# Patient Record
Sex: Male | Born: 1955 | Race: White | Hispanic: No | Marital: Married | Smoking: Never smoker
Health system: Southern US, Community
[De-identification: ages and names within clinical notes are randomized; demographics above are authoritative.]

---

## 1999-05-31 ENCOUNTER — Encounter: Admission: RE | Admit: 1999-05-31 | Discharge: 1999-05-31 | Payer: Self-pay

## 1999-06-23 ENCOUNTER — Encounter: Payer: Self-pay | Admitting: Emergency Medicine

## 1999-06-23 ENCOUNTER — Emergency Department (HOSPITAL_COMMUNITY): Admission: EM | Admit: 1999-06-23 | Discharge: 1999-06-23 | Payer: Self-pay | Admitting: Emergency Medicine

## 1999-08-16 ENCOUNTER — Other Ambulatory Visit: Admission: RE | Admit: 1999-08-16 | Discharge: 1999-08-16 | Payer: Self-pay | Admitting: Urology

## 1999-10-24 ENCOUNTER — Ambulatory Visit (HOSPITAL_COMMUNITY): Admission: RE | Admit: 1999-10-24 | Discharge: 1999-10-24 | Payer: Self-pay | Admitting: Cardiology

## 2006-04-23 ENCOUNTER — Encounter: Admission: RE | Admit: 2006-04-23 | Discharge: 2006-04-23 | Payer: Self-pay | Admitting: Neurology

## 2006-04-27 ENCOUNTER — Encounter: Admission: RE | Admit: 2006-04-27 | Discharge: 2006-04-27 | Payer: Self-pay | Admitting: Neurology

## 2007-12-27 IMAGING — CR DG SKULL COMPLETE 4+V
4 series · 4 of 4 positions shown · non-contrast
Comparison: None.

CLINICAL DATA: Question metal. Patient for MRI.

[[person_name] * (1 of 2)]
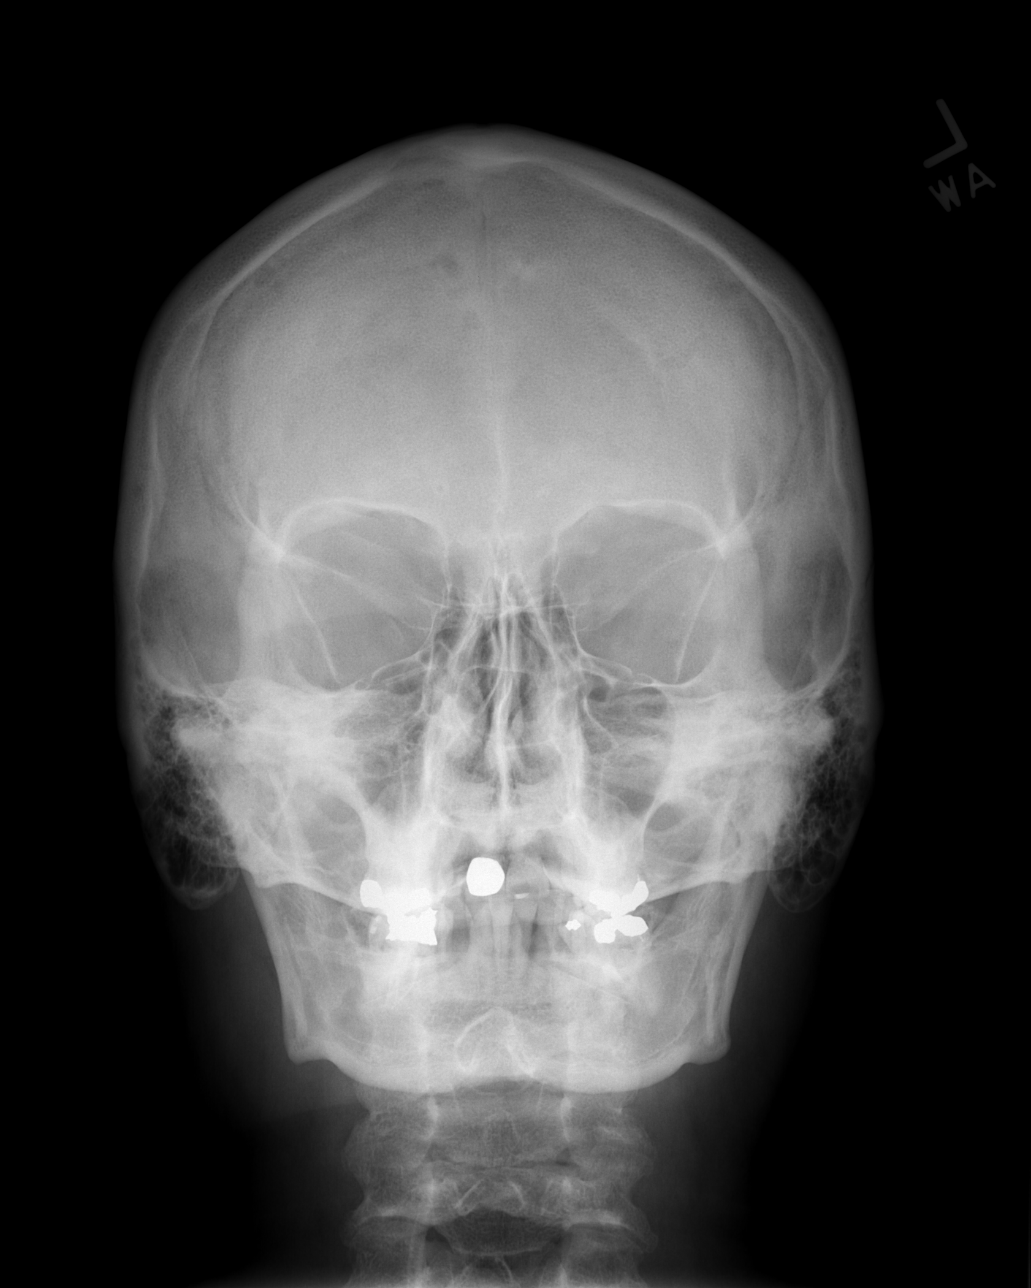

[w skull lat * (1 of 2)]
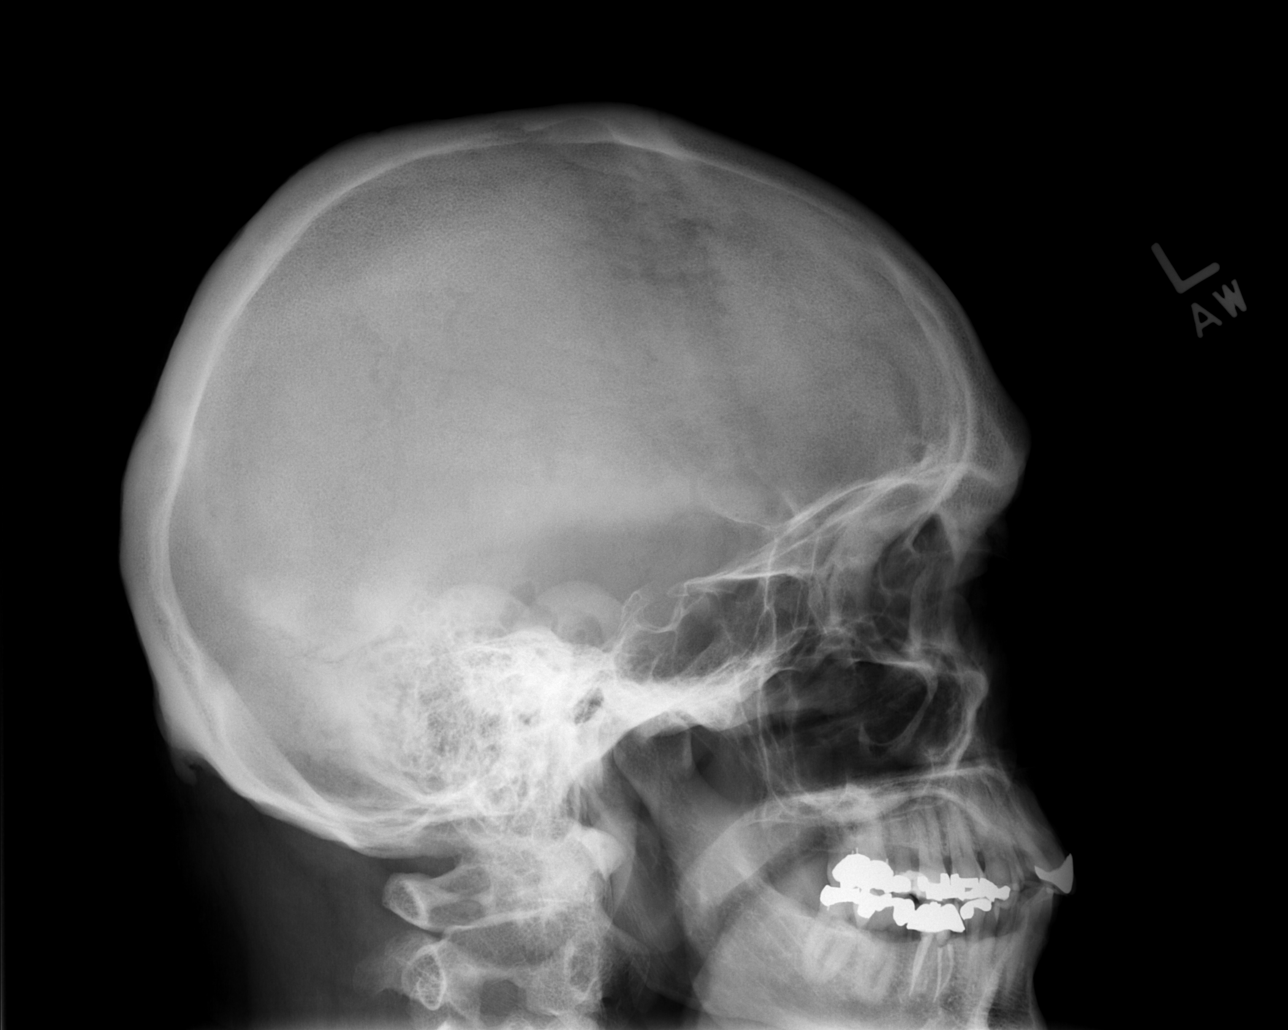

[w skull lat * (2 of 2)]
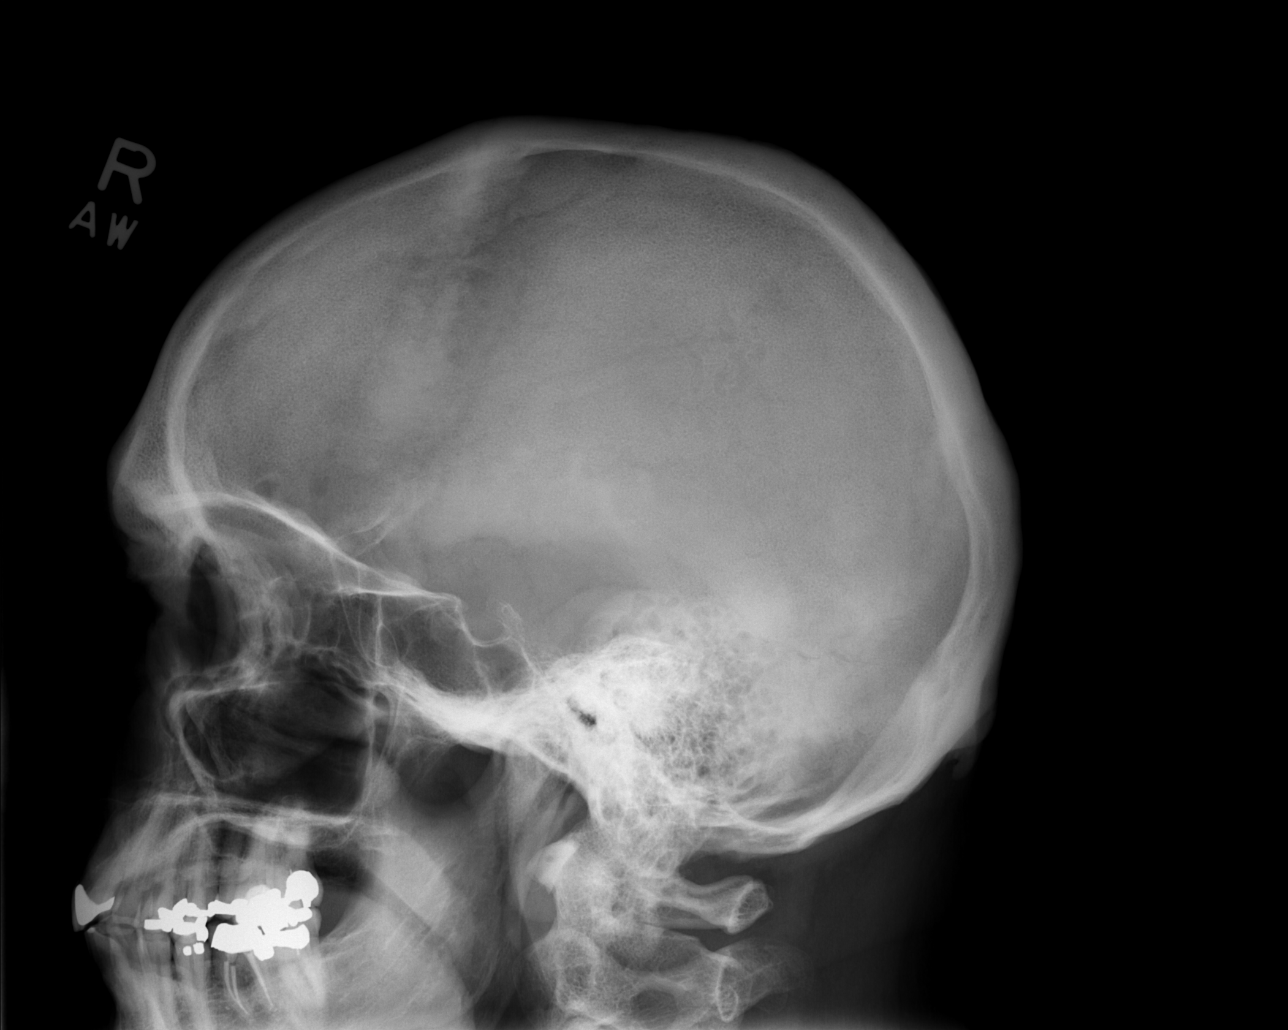

[[person_name] * (2 of 2)]
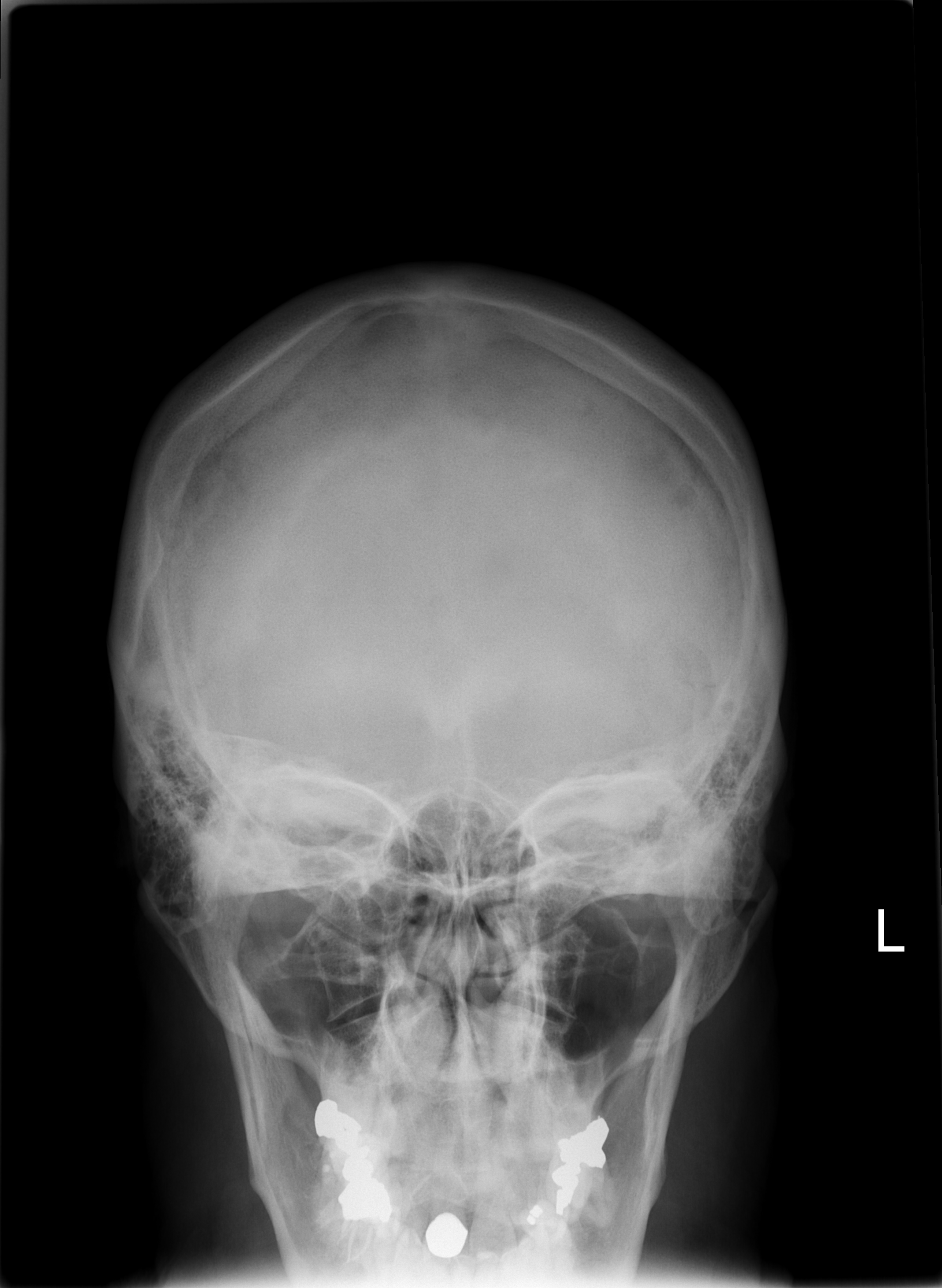

[4 of 4 positions shown; findings below may reference images not displayed]

SKULL - 4 VIEW:

No evidence for a metallic orbital foreign body. There is no evidence for
intracranial metallic foreign body such as aneurysm clip. Multiple metallic
dental fillings are apparent.
IMPRESSION: No intra orbital or intracranial metallic foreign body.

## 2015-04-28 ENCOUNTER — Telehealth: Payer: Self-pay | Admitting: *Deleted

## 2015-04-28 ENCOUNTER — Encounter: Payer: Self-pay | Admitting: Sports Medicine

## 2015-04-28 ENCOUNTER — Ambulatory Visit (INDEPENDENT_AMBULATORY_CARE_PROVIDER_SITE_OTHER): Payer: 59

## 2015-04-28 ENCOUNTER — Ambulatory Visit (INDEPENDENT_AMBULATORY_CARE_PROVIDER_SITE_OTHER): Payer: 59 | Admitting: Sports Medicine

## 2015-04-28 VITALS — BP 153/92 | HR 63 | Resp 14

## 2015-04-28 DIAGNOSIS — M722 Plantar fascial fibromatosis: Secondary | ICD-10-CM

## 2015-04-28 DIAGNOSIS — M79672 Pain in left foot: Secondary | ICD-10-CM

## 2015-04-28 DIAGNOSIS — M79671 Pain in right foot: Secondary | ICD-10-CM

## 2015-04-28 DIAGNOSIS — Z01812 Encounter for preprocedural laboratory examination: Secondary | ICD-10-CM

## 2015-04-28 MED ORDER — TRIAMCINOLONE ACETONIDE 10 MG/ML IJ SUSP: 10.0000 mg | Freq: Once | INTRAMUSCULAR | Status: AC

## 2015-04-28 MED ORDER — METHYLPREDNISOLONE 4 MG PO TBPK: ORAL_TABLET | ORAL | Status: DC

## 2015-04-28 MED ORDER — MELOXICAM 15 MG PO TABS: 15.0000 mg | ORAL_TABLET | Freq: Every day | ORAL | Status: AC

## 2015-04-28 NOTE — Telephone Encounter (Signed)
Prior authorization for MRI left foot [id number], dx M72.2 [phone] for case# [id number].

## 2015-04-28 NOTE — Patient Instructions (Signed)

## 2015-04-28 NOTE — Progress Notes (Signed)
Subjective:    Patient ID: Jared Hodges, male    DOB: [birth year], 59 y.o.   MRN: [id number]  HPI Jared Hodges 59 year old male patient presents here today for B/L foot pain with the left foot worse, has been seen by Dr Legrand [facility],[place]) and has had 4 injections (2 to the heel and 2 to the forefoot) icing, night splint, inserts, & xrays which did not help. Patient states that the left foot has been bothering him for over 6 months and was to the point where he was considering surgery however in mid June or July felt a pop in the left heel that caused the heel to feel better for 2-3 days and then the symptoms of heel pain re-occurred now to the point to where it hurts to walk. Patient admits to compensation pain to the forefoot and some residual numbness and warm sensation where he recieved injections in the forefoot. Patient denies any recent trauma, injury, knee, hip, back pain. Patient states that he desires a new podiatrist since his previous podiatrist offices has changed and is too far to drive to.   Patient reports a 2 week history of right heel pain; patient states that he is having pain similar to how his left heel initially presented with post-static dyskenisia. Patient states that heel pain in right 6/10 and desires treatment. Patient denies any related consitutional symptoms or any other pedal complaints at this time.  There are no active problems to display for this patient.  No current outpatient prescriptions on file prior to visit.   No current facility-administered medications on file prior to visit.    No Known Allergies    Review of Systems  All other systems reviewed and are negative.      Objective:   Physical Exam  General: The patient is alert and oriented x3 in no acute distress.  Dermatology: Skin is warm, dry and supple bilateral lower extremities. Nails 1-10 are normal. There is no erythema, edema, no eccymosis, no open lesions  present. Integument is otherwise unremarkable.  Vascular: Dorsalis Pedis pulse and Posterior Tibial pulse are 2/4 bilateral. Capillary fill time is immediate to all digits.  Neurological: Grossly intact to light touch with an achilles reflex of +2/5 and a  negative Tinel's sign bilateral.  Musculoskeletal: No tenderness to left forefoot. Tenderness to palpation at the medial calcaneal tubercale and through the insertion of the plantar fascia bilateral. Muscular strength 5/5 in all groups bilateral. No limitation or pain with midtarsal, stj, ankle range of motion bilateral.   Gait: Unassisted, Antalgic avoid weights on heels  Xray: 3 views right foot: Pes Cavus foot type, mild fullness at plantar fascia and enthesopathy, 1st ray elevatus. No fracture or dislocation. Patient to bring in xray reports from left foot from previous podiatrist.      Assessment & Plan:   Problem List Items Addressed This Visit    None    Visit Diagnoses    Plantar fasciitis, bilateral    -  Primary    Right acute (2 weeks) Left chronic (>6 months)    Relevant Medications    meloxicam (MOBIC) 15 MG tablet    methylPREDNISolone (MEDROL DOSEPAK) 4 MG TBPK tablet    Other Relevant Orders    DG Foot Complete Right    MR Foot Left W Wo Contrast    Foot pain, bilateral        Relevant Medications    meloxicam (MOBIC) 15 MG  tablet    methylPREDNISolone (MEDROL DOSEPAK) 4 MG TBPK tablet       -Complete examination performed. Discussed with patient in detail the condition of plantar fasciitis, how this occurs and general treatment options. Explained both conservative and surgical treatments.  -After oral consent and aseptic prep, injected into the right heel a mixture containing 1 ml of 1%  plain lidocaine, 1 ml 0.25% plain marcaine, 0.5 ml of kenalog 10 and 0.5 ml of dexamethasone phosphate. -Rx MRI without contrast for the left foot chronic plantar fasciitis and further evaluation due to long term unresolved  symptoms -Rx Medrol dose pack and Meloxicam to start after dose pack is completed -Recommended good supportive shoes and advised use of OTC insert. -Explained in detail the use of the fascial brace dispensed at today's visit. -Explained and dispensed to patient daily stretching exercises. -Recommend patient to ice affected area 1-2x daily. -Patient to return to office after MRI or sooner if problems or questions  Arise.  Jared Hodges, DPM

## 2015-05-27 ENCOUNTER — Ambulatory Visit (INDEPENDENT_AMBULATORY_CARE_PROVIDER_SITE_OTHER): Payer: 59 | Admitting: Sports Medicine

## 2015-05-27 ENCOUNTER — Encounter: Payer: Self-pay | Admitting: Sports Medicine

## 2015-05-27 DIAGNOSIS — M722 Plantar fascial fibromatosis: Secondary | ICD-10-CM | POA: Diagnosis not present

## 2015-05-27 DIAGNOSIS — M79672 Pain in left foot: Secondary | ICD-10-CM

## 2015-05-27 DIAGNOSIS — M79671 Pain in right foot: Secondary | ICD-10-CM | POA: Diagnosis not present

## 2015-05-27 NOTE — Patient Instructions (Signed)
Pre-Operative Instructions  Congratulations, you have decided to take an important step to improving your quality of life.  You can be assured that the doctors of [facility] will be with you every step of the way.  1. Plan to be at the surgery center/hospital at least 1 (one) hour prior to your scheduled time unless otherwise directed by the surgical center/hospital staff.  You must have a responsible adult accompany you, remain during the surgery and drive you home.  Make sure you have directions to the surgical center/hospital and know how to get there on time. 2. For hospital based surgery you will need to obtain a history and physical form from your family physician within 1 month prior to the date of surgery- we will give you a form for you primary physician.  3. We make every effort to accommodate the date you request for surgery.  There are however, times where surgery dates or times have to be moved.  We will contact you as soon as possible if a change in schedule is required.   4. No Aspirin/Ibuprofen for one week before surgery.  If you are on aspirin, any non-steroidal anti-inflammatory medications (Mobic, Aleve, Ibuprofen) you should stop taking it 7 days prior to your surgery.  You make take Tylenol  For pain prior to surgery.  5. Medications- If you are taking daily heart and blood pressure medications, seizure, reflux, allergy, asthma, anxiety, pain or diabetes medications, make sure the surgery center/hospital is aware before the day of surgery so they may notify you which medications to take or avoid the day of surgery. 6. No food or drink after midnight the night before surgery unless directed otherwise by surgical center/hospital staff. 7. No alcoholic beverages 24 hours prior to surgery.  No smoking 24 hours prior to or 24 hours after surgery. 8. Wear loose pants or shorts- loose enough to fit over bandages, boots, and casts. 9. No slip on shoes, sneakers are best. 10. Bring  your boot with you to the surgery center/hospital.  Also bring crutches or a walker if your physician has prescribed it for you.  If you do not have this equipment, it will be provided for you after surgery. 11. If you have not been contracted by the surgery center/hospital by the day before your surgery, call to confirm the date and time of your surgery. 12. Leave-time from work may vary depending on the type of surgery you have.  Appropriate arrangements should be made prior to surgery with your employer. 13. Prescriptions will be provided immediately following surgery by your doctor.  Have these filled as soon as possible after surgery and take the medication as directed. 14. Remove nail polish on the operative foot. 15. Wash the night before surgery.  The night before surgery wash the foot and leg well with the antibacterial soap provided and water paying special attention to beneath the toenails and in between the toes.  Rinse thoroughly with water and dry well with a towel.  Perform this wash unless told not to do so by your physician.  Enclosed: 1 Ice pack (please put in freezer the night before surgery)   1 Hibiclens skin cleaner   Pre-op Instructions  If you have any questions regarding the instructions, do not hesitate to call our office.  [place]: [address] [place], [place] [address] [phone]  [place]: [address] [place], [place] [address] [phone]  [place]: [address]  [place], [place] [address] [phone]  Dr. Richard   Tuchman DPM, Dr. Norman Regal DPM Dr. Richard Sikora DPM, Dr. M. Todd Hyatt DPM, Dr. Kathryn Egerton DPM 

## 2015-05-27 NOTE — Progress Notes (Signed)
Patient ID: Jared Hodges, male   DOB: Dec 06, 1955, 59 y.o.   MRN: [id number] Subjective: Jared Hodges is a 59 y.o. male returns to office for follow up evaluation after Right heel injection for plantar fasciitis, injection #1 administered 1 month ago. Patient states that the injection seems to help his pain. Patient denies any recent changes in medications or new problems since last visit. Patient is also here for review of MRI of left. Denies any other pedal concerns at this time.  There are no active problems to display for this patient.  Current Outpatient Prescriptions on File Prior to Visit  Medication Sig Dispense Refill  . lisinopril (PRINIVIL,ZESTRIL) 20 MG tablet TK 1 T PO DAILY FOR BLOOD PRESSURE  5  . meloxicam (MOBIC) 15 MG tablet Take 1 tablet (15 mg total) by mouth daily. 30 tablet 0  . methylPREDNISolone (MEDROL DOSEPAK) 4 MG TBPK tablet Dose Pack; Take as instructed 21 tablet 0   Current Facility-Administered Medications on File Prior to Visit  Medication Dose Route Frequency Provider Last Rate Last Dose  . triamcinolone acetonide (KENALOG) 10 MG/ML injection 10 mg  10 mg Other Once [employer], DPM       No Known Allergies  Objective:   General:  Alert and oriented x 3, in no acute distress  Dermatology: Skin is warm, dry, and supple bilateral. Nails are within normal limits. There is no lower extremity erythema, no eccymosis, no open lesions present bilateral.   Vascular: Dorsalis Pedis and Posterior Tibial pedal pulses are 2/4 bilateral. + hair growth noted bilateral. Capillary Fill Time is 3 seconds in all digits. No varicosities, No edema bilateral lower extremities.   Neurological: Sensation grossly intact to light touch with an achilles reflex of +2 and a  negative Tinel's sign bilateral. Vibratory, sharp/dull, Semmes Weinstein Monofilament within normal limits.   Musculoskeletal: There is decreased tenderness to palpation at the medial calcaneal tubercale  and through the insertion of the plantar fascia on the right foot. There is still tenderness on the left plantar fascia insertion and through medial arch. No pain to left ankle. No pain with compression to calcaneus or application of tuning fork.  All joints range of motion within normal limits bilateral. Strength 5/5 bilateral.   Assessment and Plan: Problem List Items Addressed This Visit    None    Visit Diagnoses    Plantar fasciitis of left foot    -  Primary    chronic, MRI confirmed Chronic plantar fasciitis, partial rupture of medial band with Peroneal tenosynovitis (compensation)     Plantar fasciitis of right foot        improving    Foot pain, bilateral          -Complete examination performed.  -MRI results discussed for left foot  -Discussed with patient in detail the condition of plantar fasciitis, how this  occurs related to the foot type of the patient and general treatment options. - Patient opted for surgical intervention for Left foot; Consent signed and obtained for Left EPF; risks and benefits explained; no gaurantees given; Office to schedule surgery at [facility] at next available date and time. Patient provided pre-op instruction and packet. -Dispensed CAM boot to use post-op  -Continue with Meloxicam until complete. -Continue with fascial brace daily.  -Continue with stretching, icing, good supportive shoes, inserts daily.  -Discussed long term care and reocurrence; will closely monitor; if fails to improve will consider other treatment modalities for the right.  -Patient to return  to office after surgery, left EPF for follow up or sooner if problems or questions arise.  Landis Martins, DPM

## 2015-05-31 ENCOUNTER — Telehealth: Payer: Self-pay | Admitting: *Deleted

## 2015-05-31 NOTE — Telephone Encounter (Signed)
"  We thought we would have heard something by now.  We'd like to set up my husband surgery."  How soon would you like to do it?  "He'd like to do it as soon as possible."  She can do it on 06/16/2015.  "Okay, that will be great.  We had tried to register before but couldn't.  Where will it be done at?"  Surgery is performed at [address] in the [place] area, not by the hospital.  Surgical center will call him a day or two prior to surgery with the arrival time.  Give me a chance to get it scheduled and then you can register.

## 2015-06-01 ENCOUNTER — Telehealth: Payer: Self-pay | Admitting: *Deleted

## 2015-06-01 NOTE — Telephone Encounter (Signed)
Surgery scheduled for 06/16/2015 was authorized by [facility] for code [phone].  Authorization number is [id number].  I faxed authorization to the surgical center.

## 2015-06-15 ENCOUNTER — Telehealth: Payer: Self-pay | Admitting: *Deleted

## 2015-06-15 NOTE — Telephone Encounter (Signed)
"  My husband is scheduled for surgery on tomorrow and we have not received a call about the time."  You can give the surgical center a call.  Their number is [phone].  "Okay, I'll call now."

## 2015-06-16 DIAGNOSIS — M722 Plantar fascial fibromatosis: Secondary | ICD-10-CM | POA: Diagnosis not present

## 2015-06-17 ENCOUNTER — Telehealth: Payer: Self-pay | Admitting: Sports Medicine

## 2015-06-17 NOTE — Telephone Encounter (Signed)
Post op check phone call made to patient. No answer. Left voicemail for patient to call office with any problems or concerns. Call back # provided. Patient to follow up in office in 1 week for continue post op care.

## 2015-06-24 ENCOUNTER — Ambulatory Visit (INDEPENDENT_AMBULATORY_CARE_PROVIDER_SITE_OTHER): Payer: 59 | Admitting: Sports Medicine

## 2015-06-24 ENCOUNTER — Encounter: Payer: Self-pay | Admitting: Sports Medicine

## 2015-06-24 ENCOUNTER — Ambulatory Visit (INDEPENDENT_AMBULATORY_CARE_PROVIDER_SITE_OTHER): Payer: 59

## 2015-06-24 DIAGNOSIS — Z9889 Other specified postprocedural states: Secondary | ICD-10-CM

## 2015-06-24 DIAGNOSIS — M79672 Pain in left foot: Secondary | ICD-10-CM

## 2015-06-24 DIAGNOSIS — Z8619 Personal history of other infectious and parasitic diseases: Secondary | ICD-10-CM

## 2015-06-24 DIAGNOSIS — M722 Plantar fascial fibromatosis: Secondary | ICD-10-CM

## 2015-06-24 MED ORDER — METRONIDAZOLE 500 MG PO TABS: 500.0000 mg | ORAL_TABLET | Freq: Three times a day (TID) | ORAL | Status: AC

## 2015-06-24 MED ORDER — METHYLPREDNISOLONE 4 MG PO TBPK: ORAL_TABLET | ORAL | Status: DC

## 2015-06-24 NOTE — Progress Notes (Signed)
Patient ID: Jared Hodges, male   DOB: [birth year], 59 y.o.   MRN: [id number] Subjective: Jared Hodges is a 59 y.o. male patient seen today in office for POV #1 (DOS 06-16-15), S/P Left EPF and Right heel steroid injection. Patient denies pain at surgical site, denies calf pain, denies headache, chest pain, shortness of breath, nausea, vomiting, fever, or chills. Patient states that he is doing well and not requiring pain medication. Patient reports that the injection on the right heel lasted for about 3 days and now the pain is back pretty bad; patient is concerned that he may need surgery on his right foot in the near future. No other issues noted.   Objective: General: No acute distress, AAOx3   Left foot: Sutures intact with no gapping or dehiscence at surgical site, mild swelling and brusing to left heel, no erythema, no warmth, no drainage, no signs of infection noted, Capillary fill time <3 seconds in all digits, gross sensation present via light touch to left foot. No pain or crepitation with range of motion left foot.  No pain with calf compression.   There is still tenderness to plantar fascia on right foot.   Post Op Xray, 3 views, Left foot: Normal osseous mineralization, unchanged calcaneal spur, there is small soft tissue defect at site of EPF, Soft tissue swelling within normal limits for post op status. No acute pathology or findings.   Assessment and Plan:  Problem List Items Addressed This Visit    None    Visit Diagnoses    Left foot pain    -  Primary    improving    Relevant Orders    DG Foot Complete Left    Status post foot surgery, unspecified laterality        Left EPF and Steroid injection on right, 06-16-15. Left is doing well healing with no complications, Right is still very painful    Plantar fasciitis of right foot        minimally improved after 2nd steroid injection     Relevant Medications    methylPREDNISolone (MEDROL DOSEPAK) 4 MG TBPK tablet    History of thrush        Relevant Medications    metroNIDAZOLE (FLAGYL) 500 MG tablet       -Patient seen and evaluated - For right foot rx steroid dose pack and flagyl (hx of thrush when on steroids). Advised patient to consider EPF vs EPAT if fails to improve. Cont with stretching, icing, good shoes, bracing daily on right.  -Left foot Post op xrays reviewed  -Applied dry sterile dressing to surgical site left foot secured with paper tape and stockinet  -Advised patient to make sure to keep dressings clean, dry, and intact to left surgical site -Advised patient to transition from CAM walker to post op shoe and then to sneaker over the next 2 weeks -Cont with night splint every night on left and pain medication as needed -Advised patient to limit activity to necessity on left  -Advised patient to ice and elevate as necessary on left   -Will plan for suture removal at next office visit. In the meantime, patient to call office if any issues or problems arise.   Landis Martins, DPM

## 2015-07-08 ENCOUNTER — Encounter: Payer: Self-pay | Admitting: Sports Medicine

## 2015-07-08 ENCOUNTER — Ambulatory Visit (INDEPENDENT_AMBULATORY_CARE_PROVIDER_SITE_OTHER): Payer: 59 | Admitting: Sports Medicine

## 2015-07-08 DIAGNOSIS — M79672 Pain in left foot: Secondary | ICD-10-CM

## 2015-07-08 DIAGNOSIS — M79671 Pain in right foot: Secondary | ICD-10-CM

## 2015-07-08 DIAGNOSIS — Z9889 Other specified postprocedural states: Secondary | ICD-10-CM

## 2015-07-08 DIAGNOSIS — M722 Plantar fascial fibromatosis: Secondary | ICD-10-CM

## 2015-07-08 MED ORDER — TRIAMCINOLONE ACETONIDE 10 MG/ML IJ SUSP: 10.0000 mg | Freq: Once | INTRAMUSCULAR | Status: AC

## 2015-07-08 NOTE — Progress Notes (Signed)
Patient ID: Jared Hodges, male   DOB: 23-May-1956, 59 y.o.   MRN: [id number]  Subjective: Jared Hodges is a 59 y.o. male patient seen today in office for POV #2 (DOS 06-16-15), S/P Left EPF and Right heel steroid injection. Patient denies pain at surgical site, denies calf pain, denies headache, chest pain, shortness of breath, nausea, vomiting, fever, or chills. Admits to numbness today in the left toes that he states that has been present before surgery and admits to even ocassional numbness on the right foot; feeling like he has a bunched up sock on. Patient feels like the right heel is still pretty painful and desires treatment. No other issues noted.  There are no active problems to display for this patient.  Current Outpatient Prescriptions on File Prior to Visit  Medication Sig Dispense Refill  . lisinopril (PRINIVIL,ZESTRIL) 20 MG tablet TK 1 T PO DAILY FOR BLOOD PRESSURE  5  . meloxicam (MOBIC) 15 MG tablet Take 1 tablet (15 mg total) by mouth daily. 30 tablet 0  . methylPREDNISolone (MEDROL DOSEPAK) 4 MG TBPK tablet Take as instructed (DOSE PACK) 21 tablet 0  . metroNIDAZOLE (FLAGYL) 500 MG tablet Take 1 tablet (500 mg total) by mouth 3 (three) times daily. 21 tablet 0   Current Facility-Administered Medications on File Prior to Visit  Medication Dose Route Frequency Provider Last Rate Last Dose  . triamcinolone acetonide (KENALOG) 10 MG/ML injection 10 mg  10 mg Other Once [employer], DPM       No Known Allergies    Objective: General: No acute distress, AAOx3   Left foot: Sutures intact with no gapping or dehiscence at surgical site, no swelling or ecchymosis to left heel, no erythema, no warmth, no drainage, no signs of infection noted, Capillary fill time <3 seconds in all digits, gross sensation present via light touch to left foot. There is subjective numbness to all toes with the 3,4,5 toes mostly involved. Negative tinels sign. No pain or crepitation with range of  motion left foot.  No pain with calf compression.   There is still tenderness to plantar fascia on right foot at the insertion. No other acute findings on the right foot.    Assessment and Plan:  Problem List Items Addressed This Visit    None    Visit Diagnoses    Status post left foot surgery    -  Primary    EPF, 06-16-15, healing well without complication    Plantar fasciitis of right foot        Relevant Medications    triamcinolone acetonide (KENALOG) 10 MG/ML injection 10 mg    Foot pain, bilateral           -Patient seen and evaluated - For right foot, After verbal consent injected 3cc mixture of lidocaine, marcaine, dex, kenalog-10 to right heel at medial calcaneal tubercle via direct plantar approach. Advised patient to consider EPF vs EPAT if fails to improve. Cont with stretching, icing, good shoes, bracing daily on right daily.  -For left foot, removed sutures and applied steristrips to site. Patient may now shower/bathe as normal.  -Dispensed compression anklet for left to prevent swelling.  -Advised patient to transition from post op shoe to sneaker over the next 2 weeks -Cont with night splint every night on left and pain medication as needed until follow up visit -Advised patient to limit activity to necessity and continue with ice and elevation as needed daily  -Patient to return in 2 weeks  for recheck; discussed with patient if numbness progresses will send to neurology/NCV/EMG. In the meantime, advised patient to follow up for PCP for diabetic work up. Patient to call office if any issues or problems arise.   Landis Martins, DPM

## 2015-07-12 ENCOUNTER — Encounter: Payer: Self-pay | Admitting: Sports Medicine

## 2015-07-14 ENCOUNTER — Telehealth: Payer: Self-pay | Admitting: *Deleted

## 2015-07-14 NOTE — Telephone Encounter (Signed)
Pt's wife, Jana Half states pt has decided to have the Shockwave on the other foot, would they be able to have it performed the same day 07/22/2015 appt.  I told Bryson Ha was only performed in the [place] office, and by a trained nurse, but Dr. Cannon Kettle would want to instruct pt in the procedure, and aftercare at the next appt.

## 2015-07-22 ENCOUNTER — Encounter: Payer: 59 | Admitting: Sports Medicine

## 2015-07-22 ENCOUNTER — Ambulatory Visit (INDEPENDENT_AMBULATORY_CARE_PROVIDER_SITE_OTHER): Payer: 59 | Admitting: Sports Medicine

## 2015-07-22 ENCOUNTER — Encounter: Payer: Self-pay | Admitting: Sports Medicine

## 2015-07-22 DIAGNOSIS — M792 Neuralgia and neuritis, unspecified: Secondary | ICD-10-CM

## 2015-07-22 DIAGNOSIS — M79671 Pain in right foot: Secondary | ICD-10-CM

## 2015-07-22 DIAGNOSIS — Z9889 Other specified postprocedural states: Secondary | ICD-10-CM

## 2015-07-22 DIAGNOSIS — M79672 Pain in left foot: Secondary | ICD-10-CM

## 2015-07-22 DIAGNOSIS — M722 Plantar fascial fibromatosis: Secondary | ICD-10-CM

## 2015-07-22 NOTE — Progress Notes (Signed)
DOS 06-16-15  EPF left   Rx'd Percocet 7.5/325  #30 and Phenergan 25 mg #30

## 2015-07-22 NOTE — Progress Notes (Signed)
Patient ID: Jared Hodges, male   DOB: 1956/02/07, 59 y.o.   MRN: [id number]  Subjective: Jared Hodges is a 59 y.o. male patient seen today in office for POV #3 (DOS 06-16-15), S/P Left EPF and Right heel steroid injection; patient received another injection via plantar approach at right heel 2 weeks ago. Patient denies pain at surgical site on left, denies calf pain, denies headache, chest pain, shortness of breath, nausea, vomiting, fever, or chills. Admits to numbness  The ball of the left greater than right foot that he states that has been present before surgery; feeling like he has a bunched up sock on;  State that this pain is becoming more persistent. Patient feels like the right heel is still pretty painful and desires to further discuss treatment options; pain has required crutches. No other issues noted.  There are no active problems to display for this patient.  Current Outpatient Prescriptions on File Prior to Visit  Medication Sig Dispense Refill  . lisinopril (PRINIVIL,ZESTRIL) 20 MG tablet TK 1 T PO DAILY FOR BLOOD PRESSURE  5  . meloxicam (MOBIC) 15 MG tablet Take 1 tablet (15 mg total) by mouth daily. 30 tablet 0  . methylPREDNISolone (MEDROL DOSEPAK) 4 MG TBPK tablet Take as instructed (DOSE PACK) 21 tablet 0  . metroNIDAZOLE (FLAGYL) 500 MG tablet Take 1 tablet (500 mg total) by mouth 3 (three) times daily. 21 tablet 0   Current Facility-Administered Medications on File Prior to Visit  Medication Dose Route Frequency Provider Last Rate Last Dose  . triamcinolone acetonide (KENALOG) 10 MG/ML injection 10 mg  10 mg Other Once [employer], DPM      . triamcinolone acetonide (KENALOG) 10 MG/ML injection 10 mg  10 mg Other Once [employer], DPM       No Known Allergies    Objective: General: No acute distress, AAOx3   Left foot:  Incisions to Left heel well coapted, no swelling or ecchymosis to left heel, no erythema, no warmth, no drainage, no signs of infection  noted, Capillary fill time <3 seconds in all digits, gross sensation present via light touch to left foot. There is subjective numbness to all toes with the 3,4,5 toes mostly involved  Especially at the plantar aspect feeling like patient is walking on bunched up sock. Negative tinels sign/mulder's sign. No pain or crepitation with range of motion left foot.  No pain with calf compression.   There is still tenderness to plantar fascia on right foot at the insertion. Neurovascular status intact. Negative Tinel's or Mulder sign. No other acute findings on the right foot.    Assessment and Plan:  Problem List Items Addressed This Visit    None    Visit Diagnoses    Status post left foot surgery    -  Primary    EPF, 06-16-15    Plantar fasciitis of right foot        Not responding to conservative care    Neuritis        L>R foot at ball feel like a "wet/bunched up sock"    Foot pain, bilateral           -Patient seen and evaluated - For right foot, Advised patient to consider EPF vs EPAT  Since this side has not responded to any conservative therapies; patient states that he would like to try EPAT and will arrange this through our [place] office -For left foot, surgical site appears to be doing much improved patient  to continue with good supportive shoes and daily stretching.  -Cont with night splint with alternation between right and left and pain medication as needed until follow up visit -Advised patient to limit activity to necessity and continue with protection, ice and elevation as needed daily for right and continue with good supportive shoes daily -For numbness advised patient to follow up for PCP for diabetic work up. Discussed with patient that once diabetes is ruled out, if persists will pursue neurology for nerve conduction studies/ EMGs to evaluate any other etiologies since MRI and clinical exam appears negative for any neuroma or localized nerve entrapment at the level of the  toes. Patient to call office if any issues or problems arise.   Landis Martins, DPM

## 2015-07-30 ENCOUNTER — Ambulatory Visit (INDEPENDENT_AMBULATORY_CARE_PROVIDER_SITE_OTHER): Payer: 59

## 2015-07-30 ENCOUNTER — Encounter: Payer: Self-pay | Admitting: Sports Medicine

## 2015-07-30 ENCOUNTER — Ambulatory Visit (INDEPENDENT_AMBULATORY_CARE_PROVIDER_SITE_OTHER): Payer: 59 | Admitting: Sports Medicine

## 2015-07-30 DIAGNOSIS — M722 Plantar fascial fibromatosis: Secondary | ICD-10-CM

## 2015-07-30 DIAGNOSIS — M79671 Pain in right foot: Secondary | ICD-10-CM

## 2015-07-30 DIAGNOSIS — M79672 Pain in left foot: Secondary | ICD-10-CM

## 2015-07-30 DIAGNOSIS — M792 Neuralgia and neuritis, unspecified: Secondary | ICD-10-CM

## 2015-07-30 DIAGNOSIS — Z9889 Other specified postprocedural states: Secondary | ICD-10-CM

## 2015-07-30 MED ORDER — HYDROCODONE-ACETAMINOPHEN 7.5-325 MG PO TABS: 1.0000 | ORAL_TABLET | Freq: Four times a day (QID) | ORAL | Status: AC | PRN

## 2015-07-30 NOTE — Progress Notes (Addendum)
Patient ID: Jared Hodges, male   DOB: [birth year], 60 y.o.   MRN: [id number]  Subjective: Jared Hodges is a 60 y.o. male patient seen today in office for POV #4 (DOS 06-16-15), S/P Left EPF and Right heel steroid injection; patient states that he was getting into his truck a few days ago and heard a pop and felt intense pain in his right heel; states that pain is a little better but still causing him to limp. Patient states that the left foot is doing ok but starting to feel a little pain in left heel concerned that the fascia may have started to scar back down. Patient still also has numbness in toes Left>Right as noted prior. No other issues noted.  There are no active problems to display for this patient.  Current Outpatient Prescriptions on File Prior to Visit  Medication Sig Dispense Refill  . lisinopril (PRINIVIL,ZESTRIL) 20 MG tablet TK 1 T PO DAILY FOR BLOOD PRESSURE  5  . meloxicam (MOBIC) 15 MG tablet Take 1 tablet (15 mg total) by mouth daily. 30 tablet 0  . methylPREDNISolone (MEDROL DOSEPAK) 4 MG TBPK tablet Take as instructed (DOSE PACK) 21 tablet 0  . metroNIDAZOLE (FLAGYL) 500 MG tablet Take 1 tablet (500 mg total) by mouth 3 (three) times daily. 21 tablet 0   Current Facility-Administered Medications on File Prior to Visit  Medication Dose Route Frequency Provider Last Rate Last Dose  . triamcinolone acetonide (KENALOG) 10 MG/ML injection 10 mg  10 mg Other Once [employer], DPM      . triamcinolone acetonide (KENALOG) 10 MG/ML injection 10 mg  10 mg Other Once [employer], DPM       No Known Allergies    Objective: General: No acute distress, AAOx3   Left foot: There is mild tenderness to medial calcaneal tubercle, Incisions to Left heel well coapted, no swelling or ecchymosis to left heel, no erythema, no warmth, no drainage, no signs of infection noted, Capillary fill time <3 seconds in all digits, gross sensation present via light touch to left foot. There is  subjective numbness to all toes with the 3,4,5 toes mostly involved  Especially at the plantar aspect feeling like patient is walking on bunched up sock. Negative tinels sign/mulder's sign. No pain or crepitation with range of motion left foot.  No pain with calf compression.   Right foot: There moderate tenderness to plantar fascia on right foot at the insertion; no ecchymosis, no obvious palpable gap or dell. Neurovascular status intact. Negative Tinel's or Mulder sign. Negative pain with tuning fork to calcaneus. No other acute findings on the right foot.    Xray Right foot: No acute pathology, no fracture, no dislocation.  Assessment and Plan:  Problem List Items Addressed This Visit    None    Visit Diagnoses    Right foot pain    -  Primary    Relevant Medications    HYDROcodone-acetaminophen (NORCO) 7.5-325 MG tablet    Other Relevant Orders    DG Foot 2 Views Right    Plantar fasciitis of right foot        Re-exacerabation with possible partial rupture; heard a pop a few days ago    Relevant Medications    HYDROcodone-acetaminophen (NORCO) 7.5-325 MG tablet    Status post left foot surgery        EPF    Left foot pain        Neuritis           -  Patient seen and evaluated -Right foot xrays obtained and reviewed with patient -For right foot, Advised patient to return to CAM boot, careful with gentle stretching, ice, and Norco for pain as needed. Will re-assess site on Monday to determine if we will start Shockwave vs order MRI for further Eval.  -For left foot, surgical site appears to be improving however has a little pain that is starting; patient to continue with good supportive shoes and daily stretching/Night splint. Will complimentary treat site with Shockwave on Monday. -For numbness advised patient to follow up for PCP for diabetic work up. Discussed with patient that once diabetes is ruled out, if persists will pursue neurology for nerve conduction studies/ EMGs to  evaluate any other etiologies since MRI and clinical exam appears negative for any neuroma or localized nerve entrapment at the level of the toes. Patient to see PCP on Monday morning. -Patient to call office if any issues or problems arise.   Jared Hodges, DPM

## 2015-08-02 ENCOUNTER — Ambulatory Visit: Payer: 59 | Admitting: Sports Medicine

## 2015-08-02 ENCOUNTER — Ambulatory Visit (INDEPENDENT_AMBULATORY_CARE_PROVIDER_SITE_OTHER): Payer: 59 | Admitting: Sports Medicine

## 2015-08-02 ENCOUNTER — Encounter: Payer: Self-pay | Admitting: Sports Medicine

## 2015-08-02 DIAGNOSIS — M722 Plantar fascial fibromatosis: Secondary | ICD-10-CM

## 2015-08-02 DIAGNOSIS — M79672 Pain in left foot: Secondary | ICD-10-CM

## 2015-08-02 DIAGNOSIS — M792 Neuralgia and neuritis, unspecified: Secondary | ICD-10-CM

## 2015-08-02 DIAGNOSIS — M79671 Pain in right foot: Secondary | ICD-10-CM

## 2015-08-02 DIAGNOSIS — Z9889 Other specified postprocedural states: Secondary | ICD-10-CM

## 2015-08-02 MED ORDER — GABAPENTIN 100 MG PO CAPS: 300.0000 mg | ORAL_CAPSULE | Freq: Every day | ORAL | Status: AC

## 2015-08-02 MED ORDER — TRAMADOL HCL 50 MG PO TABS: 50.0000 mg | ORAL_TABLET | Freq: Three times a day (TID) | ORAL | Status: AC | PRN

## 2015-08-02 NOTE — Progress Notes (Signed)
Patient ID: Jared Hodges, male   DOB: Nov 21, 1955, 60 y.o.   MRN: [id number]  Subjective: Jared Hodges is a 60 y.o. male patient seen today in office for POV #5 (DOS 06-16-15), S/P Left EPF and Right heel steroid injection; patient had injury to right foot few days ago and states that it feels like it is easing up; feels best in rubber boots. Patient still also has numbness in toes Left>Right as noted prior; Patient to follow up with PCP for diabetic work up. No other issues noted.  There are no active problems to display for this patient.  Current Outpatient Prescriptions on File Prior to Visit  Medication Sig Dispense Refill  . HYDROcodone-acetaminophen (NORCO) 7.5-325 MG tablet Take 1 tablet by mouth every 6 (six) hours as needed for moderate pain. 30 tablet 0  . lisinopril (PRINIVIL,ZESTRIL) 20 MG tablet TK 1 T PO DAILY FOR BLOOD PRESSURE  5  . meloxicam (MOBIC) 15 MG tablet Take 1 tablet (15 mg total) by mouth daily. 30 tablet 0  . methylPREDNISolone (MEDROL DOSEPAK) 4 MG TBPK tablet Take as instructed (DOSE PACK) 21 tablet 0  . metroNIDAZOLE (FLAGYL) 500 MG tablet Take 1 tablet (500 mg total) by mouth 3 (three) times daily. 21 tablet 0   Current Facility-Administered Medications on File Prior to Visit  Medication Dose Route Frequency Provider Last Rate Last Dose  . triamcinolone acetonide (KENALOG) 10 MG/ML injection 10 mg  10 mg Other Once [employer], DPM      . triamcinolone acetonide (KENALOG) 10 MG/ML injection 10 mg  10 mg Other Once [employer], DPM       No Known Allergies    Objective: General: No acute distress, AAOx3   Left foot: No acute changes; There is mild tenderness to medial calcaneal tubercle, Incisions to Left heel well coapted, no swelling or ecchymosis to left heel, no erythema, no warmth, no drainage, no signs of infection noted, Capillary fill time <3 seconds in all digits, gross sensation present via light touch to left foot. There is subjective  numbness to all toes with the 3,4,5 toes mostly involved  Especially at the plantar aspect feeling like patient is walking on bunched up sock. Negative tinels sign/mulder's sign. No pain or crepitation with range of motion left foot.  No pain with calf compression.   Right foot: There is decreased tenderness to plantar fascia on right foot at the insertion; no ecchymosis, no obvious palpable gap or dell. Neurovascular status intact. Negative Tinel's or Mulder sign. Negative pain with tuning fork to calcaneus. No other acute findings on the right foot.    Assessment and Plan:  Problem List Items Addressed This Visit    None    Visit Diagnoses    Foot pain, bilateral    -  Primary    Relevant Medications    traMADol (ULTRAM) 50 MG tablet    Plantar fasciitis, bilateral        Relevant Medications    traMADol (ULTRAM) 50 MG tablet    Neuritis        Relevant Medications    gabapentin (NEURONTIN) 100 MG capsule    Status post foot surgery, unspecified laterality           -Patient seen and evaluated -For right foot, Advised patient to transisition from  CAM boot, careful with gentle stretching, ice, and Changed Norco to Tramadol for pain as needed. Will hold off on Shockwave at this time -For left foot, surgical site appears to  be improving however has a little pain that is starting; patient to continue with good supportive shoes and daily stretching/Night splint. Will closely monitor advised patient that may take several weeks to months to get better -For numbness advised patient to follow up for PCP for diabetic work up. Discussed with patient that once diabetes is ruled out, if persists will pursue neurology for nerve conduction studies/ EMGs to evaluate any other etiologies since MRI and clinical exam appears negative for any neuroma or localized nerve entrapment at the level of the toes. In the meantime Rx Gabapentin 300mg  qhs to see if that will offer symptomatic relief for numbness in  toes -Patient to return in 4 weeks or sooner. Patient to call office if any issues or problems arise.   Landis Martins, DPM

## 2015-08-19 ENCOUNTER — Ambulatory Visit (INDEPENDENT_AMBULATORY_CARE_PROVIDER_SITE_OTHER): Payer: 59 | Admitting: Sports Medicine

## 2015-08-19 ENCOUNTER — Encounter: Payer: Self-pay | Admitting: Sports Medicine

## 2015-08-19 DIAGNOSIS — M722 Plantar fascial fibromatosis: Secondary | ICD-10-CM

## 2015-08-19 DIAGNOSIS — M792 Neuralgia and neuritis, unspecified: Secondary | ICD-10-CM

## 2015-08-19 DIAGNOSIS — M79671 Pain in right foot: Secondary | ICD-10-CM

## 2015-08-19 DIAGNOSIS — M79672 Pain in left foot: Secondary | ICD-10-CM

## 2015-08-19 DIAGNOSIS — Z9889 Other specified postprocedural states: Secondary | ICD-10-CM

## 2015-08-19 NOTE — Progress Notes (Signed)
Patient ID: Jared Hodges, male   DOB: [birth year], 60 y.o.   MRN: [id number]  Subjective: Jared Hodges is a 60 y.o. male patient seen today in office for POV #6 (DOS 06-16-15), S/P Left EPF and Right heel steroid injection; patient had a re-injury to right foot few weeks ago and states that it feels like it is easing up; feels best in good supportive shoes; Patient still also has numbness in toes Left>Right as noted prior that is also getting better and is less constant; Patient saw PCP for diabetic work up of which he was started on metformin for prediabetes. No other issues noted.  There are no active problems to display for this patient.  Current Outpatient Prescriptions on File Prior to Visit  Medication Sig Dispense Refill  . gabapentin (NEURONTIN) 100 MG capsule Take 3 capsules (300 mg total) by mouth at bedtime. 90 capsule 3  . HYDROcodone-acetaminophen (NORCO) 7.5-325 MG tablet Take 1 tablet by mouth every 6 (six) hours as needed for moderate pain. 30 tablet 0  . lisinopril (PRINIVIL,ZESTRIL) 20 MG tablet TK 1 T PO DAILY FOR BLOOD PRESSURE  5  . meloxicam (MOBIC) 15 MG tablet Take 1 tablet (15 mg total) by mouth daily. 30 tablet 0  . metroNIDAZOLE (FLAGYL) 500 MG tablet Take 1 tablet (500 mg total) by mouth 3 (three) times daily. 21 tablet 0  . traMADol (ULTRAM) 50 MG tablet Take 1 tablet (50 mg total) by mouth every 8 (eight) hours as needed. 30 tablet 0   Current Facility-Administered Medications on File Prior to Visit  Medication Dose Route Frequency Provider Last Rate Last Dose  . triamcinolone acetonide (KENALOG) 10 MG/ML injection 10 mg  10 mg Other Once [employer], DPM      . triamcinolone acetonide (KENALOG) 10 MG/ML injection 10 mg  10 mg Other Once [employer], DPM       No Known Allergies    Objective: General: No acute distress, AAOx3   Left foot: No acute changes; There is mild tenderness to medial calcaneal tubercle, Incisions to Left heel well coapted, mild  swelling at  Medial calcaneal tubercle, no ecchymosis to left heel, no erythema, no warmth, no drainage, no signs of infection noted, Capillary fill time <3 seconds in all digits, gross sensation present via light touch to left foot. There is decreased subjective numbness to all toes with the 3,4,5 toes mostly involved and decreased feeling like patient is walking on bunched up sock. Negative tinels sign/mulder's sign. No pain or crepitation with range of motion left foot.  No pain with calf compression.   Right foot: There is decreased tenderness to plantar fascia on right foot at the insertion; no ecchymosis, no obvious palpable gap or dell. Neurovascular status intact. Negative Tinel's or Mulder sign. Negative pain with tuning fork to calcaneus. No other acute findings on the right foot.    Assessment and Plan:  Problem List Items Addressed This Visit    None    Visit Diagnoses    Foot pain, bilateral    -  Primary    Plantar fasciitis, bilateral        Neuritis        Status post foot surgery, unspecified laterality        L EPF       -Patient seen and evaluated - Discussed continued plan of care/ Long-term treatment options. Advised patient will continue to monitor and that this condition may take several weeks to months to get better.  Patient expressed understanding and reports he feels like he is making a little improvement. However, still feels like there is limitation with ability to completely function or walk without pain or discomfort.  -Rx physical therapy at [facility] with dry needling bilateral foot pain, plantar fasciitis, neuritis, status post left EPF and with other treatment modalities -Cont with tramadol as needed for pain and Gabapentin qhs.  -Continue with icing and night splint daily -Recommend continue with good supportive shoes daily -Patient to return in 4 weeks or sooner. Patient to call office if any issues or problems arise.   Landis Martins, DPM

## 2015-09-22 ENCOUNTER — Encounter: Payer: Self-pay | Admitting: Sports Medicine

## 2015-09-22 ENCOUNTER — Ambulatory Visit (INDEPENDENT_AMBULATORY_CARE_PROVIDER_SITE_OTHER): Payer: 59 | Admitting: Sports Medicine

## 2015-09-22 DIAGNOSIS — M79672 Pain in left foot: Secondary | ICD-10-CM

## 2015-09-22 DIAGNOSIS — M79671 Pain in right foot: Secondary | ICD-10-CM

## 2015-09-22 DIAGNOSIS — M792 Neuralgia and neuritis, unspecified: Secondary | ICD-10-CM

## 2015-09-22 DIAGNOSIS — M722 Plantar fascial fibromatosis: Secondary | ICD-10-CM

## 2015-09-22 DIAGNOSIS — Z9889 Other specified postprocedural states: Secondary | ICD-10-CM

## 2015-09-22 NOTE — Progress Notes (Signed)
Patient ID: Jared Hodges, male   DOB: 13-Jan-1956, 60 y.o.   MRN: [id number]  Subjective: Jared Hodges is a 60 y.o. diabetic male patient seen today in office for POV #7 (DOS 06-16-15), S/P Left EPF and Right heel steroid injection; Patient reports that he is feeling better and has returned back to work. Reports that he has occasional pain at the incision site, left heel and pain to the ball of foot still feeling as if there is a bunched up sock to the ball of foot on left. States that this pain is more aggravating and annoying because of starting to change the way he walks. He is noticing that he is bearing more pressure on the side of the foot and also he is curling toes. Report that the right foot is doing good. He admits that he has not had time to do therapy yet however would still like to go. No other issues noted.  FBS 90  There are no active problems to display for this patient.  Current Outpatient Prescriptions on File Prior to Visit  Medication Sig Dispense Refill  . gabapentin (NEURONTIN) 100 MG capsule Take 3 capsules (300 mg total) by mouth at bedtime. 90 capsule 3  . HYDROcodone-acetaminophen (NORCO) 7.5-325 MG tablet Take 1 tablet by mouth every 6 (six) hours as needed for moderate pain. 30 tablet 0  . lisinopril (PRINIVIL,ZESTRIL) 20 MG tablet TK 1 T PO DAILY FOR BLOOD PRESSURE  5  . meloxicam (MOBIC) 15 MG tablet Take 1 tablet (15 mg total) by mouth daily. 30 tablet 0  . metFORMIN (GLUCOPHAGE-XR) 500 MG 24 hr tablet TK 1 T PO D  3  . methylPREDNISolone (MEDROL DOSEPAK) 4 MG TBPK tablet TK UTD  0  . metroNIDAZOLE (FLAGYL) 500 MG tablet Take 1 tablet (500 mg total) by mouth 3 (three) times daily. 21 tablet 0  . omeprazole (PRILOSEC) 20 MG capsule TK 1 C PO QD FOR ACID SUPPRESSION  1  . promethazine (PHENERGAN) 25 MG tablet     . traMADol (ULTRAM) 50 MG tablet Take 1 tablet (50 mg total) by mouth every 8 (eight) hours as needed. 30 tablet 0   Current Facility-Administered  Medications on File Prior to Visit  Medication Dose Route Frequency Provider Last Rate Last Dose  . triamcinolone acetonide (KENALOG) 10 MG/ML injection 10 mg  10 mg Other Once [employer], DPM      . triamcinolone acetonide (KENALOG) 10 MG/ML injection 10 mg  10 mg Other Once [employer], DPM       No Known Allergies    Objective: General: No acute distress, AAOx3   Left foot: ; There is mild tenderness to medial Incision site at Left heel, incisions are well healed, decreased swelling at  Medial calcaneal tubercle, no ecchymosis to left heel, no erythema, no warmth, no drainage, no signs of infection noted, Capillary fill time <3 seconds in all digits, gross sensation present via light touch to left foot. There is no subjective numbness to all toes with the 3,4,5 toes however still the feeling of walking on "bunched up sock". Negative tinels sign/mulder's sign. No pain or crepitation with range of motion left foot.  No pain with calf compression.   Right foot: There is decreased tenderness to plantar fascia on right foot at the insertion; no ecchymosis, no obvious palpable gap or dell. Neurovascular status intact. Negative Tinel's or Mulder sign. Negative pain with tuning fork to calcaneus. No other acute findings on the right foot.  Assessment and Plan:  Problem List Items Addressed This Visit    None    Visit Diagnoses    Foot pain, bilateral    -  Primary    Plantar fasciitis, bilateral        Neuritis        Status post foot surgery, unspecified laterality        Left EPF 06-16-15       -Patient seen and evaluated - Discussed continued plan of care/ Long-term treatment options.  -Patient to start physical therapy at [facility] with dry needling bilateral foot pain, plantar fasciitis, neuritis, status post left EPF and with other treatment modalities -Cont with Gabapentin qhs.  -Continue with icing and night splint daily as needed -Recommend continue with good supportive  shoes daily and added felt met pad to left tennis shoe; discussed that if this works well will consider custom orthotics -Patient to return in 6 weeks or sooner. Patient to call office if any issues or problems arise.   Landis Martins, DPM

## 2015-11-03 ENCOUNTER — Encounter: Payer: 59 | Admitting: Sports Medicine

## 2015-11-10 NOTE — Progress Notes (Signed)
This encounter was created in error - please disregard.

## 2015-11-18 ENCOUNTER — Telehealth: Payer: Self-pay | Admitting: Sports Medicine

## 2015-11-18 NOTE — Telephone Encounter (Signed)
Patient came by office to talk about plan of care. I was not present so I called patient back however there was no answer so left message with call back #. -Dr. Cannon Kettle

## 2015-11-18 NOTE — Telephone Encounter (Signed)
Patient returned my call and we discussed plan of care. Patient admits that he has not been to therapy or done any of the treatments that I recommended since last encounter. Thus, I recommended patient to continue with gabapentin for Neuritis-like symptoms and to also take prescription to [facility] for physical therapy. Advised patient to continue with good supportive shoes, stretching, icing and anti-inflammatories as needed. Patient to follow-up with me in 3-4 weeks. I advised patient since it is after hours to call office on tomorrow for appointment with me for follow-up in the meantime. Also provided patient with phone number to physical therapy at [facility] with instructions to call and I'll also fax a new prescription over for patient. -Dr. Cannon Kettle

## 2016-01-13 ENCOUNTER — Ambulatory Visit (INDEPENDENT_AMBULATORY_CARE_PROVIDER_SITE_OTHER): Payer: 59 | Admitting: Sports Medicine

## 2016-01-13 ENCOUNTER — Encounter: Payer: Self-pay | Admitting: Sports Medicine

## 2016-01-13 DIAGNOSIS — M79671 Pain in right foot: Secondary | ICD-10-CM

## 2016-01-13 DIAGNOSIS — M792 Neuralgia and neuritis, unspecified: Secondary | ICD-10-CM

## 2016-01-13 DIAGNOSIS — M79672 Pain in left foot: Secondary | ICD-10-CM

## 2016-01-13 DIAGNOSIS — Z9889 Other specified postprocedural states: Secondary | ICD-10-CM

## 2016-01-13 DIAGNOSIS — M722 Plantar fascial fibromatosis: Secondary | ICD-10-CM

## 2016-01-13 NOTE — Progress Notes (Signed)
Patient ID: Jared Hodges, male   DOB: Mar 09, 1956, 60 y.o.   MRN: [id number]  Subjective: Jared Hodges is a 60 y.o. diabetic male patient seen today in office for POV #8 (DOS 06-16-15), S/P Left EPF and Right heel steroid injection; Patient reports that he is doing better, has attended some sessions of therapy, but has not been consistent. Reports that therapy has helped tremendously and that he is gaining back quality of life. Admits that he has been diagnosed with autoimmune hepatitis and is going through extensive medical care and will have to wait for his for this No other issues noted.  FBS 98  There are no active problems to display for this patient.  Current Outpatient Prescriptions on File Prior to Visit  Medication Sig Dispense Refill  . gabapentin (NEURONTIN) 100 MG capsule Take 3 capsules (300 mg total) by mouth at bedtime. 90 capsule 3  . HYDROcodone-acetaminophen (NORCO) 7.5-325 MG tablet Take 1 tablet by mouth every 6 (six) hours as needed for moderate pain. 30 tablet 0  . lisinopril (PRINIVIL,ZESTRIL) 20 MG tablet TK 1 T PO DAILY FOR BLOOD PRESSURE  5  . meloxicam (MOBIC) 15 MG tablet Take 1 tablet (15 mg total) by mouth daily. 30 tablet 0  . metFORMIN (GLUCOPHAGE-XR) 500 MG 24 hr tablet TK 1 T PO D  3  . methylPREDNISolone (MEDROL DOSEPAK) 4 MG TBPK tablet TK UTD  0  . metroNIDAZOLE (FLAGYL) 500 MG tablet Take 1 tablet (500 mg total) by mouth 3 (three) times daily. 21 tablet 0  . omeprazole (PRILOSEC) 20 MG capsule TK 1 C PO QD FOR ACID SUPPRESSION  1  . promethazine (PHENERGAN) 25 MG tablet     . traMADol (ULTRAM) 50 MG tablet Take 1 tablet (50 mg total) by mouth every 8 (eight) hours as needed. 30 tablet 0   Current Facility-Administered Medications on File Prior to Visit  Medication Dose Route Frequency Provider Last Rate Last Dose  . triamcinolone acetonide (KENALOG) 10 MG/ML injection 10 mg  10 mg Other Once [employer], DPM      . triamcinolone acetonide (KENALOG)  10 MG/ML injection 10 mg  10 mg Other Once [employer], DPM       No Known Allergies    Objective: General: No acute distress, AAOx3   Left foot: There is no tenderness left heel at surgical sites, no ecchymosis to left heel, no erythema, no warmth, no drainage, no signs of infection noted, Capillary fill time <3 seconds in all digits, gross sensation present via light touch to left foot. There is no subjective numbness to all toes with the 3,4,5 toes and resolved feeling of bunched up sock. Negative tinels sign/mulder's sign. No pain or crepitation with range of motion left foot.  No pain with calf compression.   Right foot: There is decreased tenderness to plantar fascia on right foot at the insertion; no ecchymosis, no obvious palpable gap or dell. Neurovascular status intact. Negative Tinel's or Mulder sign. Negative pain with tuning fork to calcaneus. No other acute findings on the right foot.    Assessment and Plan:  Problem List Items Addressed This Visit    None    Visit Diagnoses    Status post foot surgery, unspecified laterality    -  Primary    Plantar fasciitis, bilateral        Neuritis        Foot pain, bilateral           -Patient seen  and evaluated -Discussed continued plan of care/ Long-term treatment options.  -Patient to Continue with physical therapy at [facility] with dry needling bilateral foot pain, plantar fasciitis, neuritis, status post left EPF and with other treatment modalities; advised patient to go to continue his treatments to get optimum results -Cont with Gabapentin qhs as needed -Continue with icing and night splint daily as needed -Recommend continue with good supportive shoes daily; will consider custom orthotics at a later time -Patient to return after he has had a few sessions of therapy for re-evaluation. Advised patient that I do not want overload him with appointments since he is dealing with hepatitis which is much more concerning at this  time. Patient to call office if any issues or problems arise.   Landis Martins, DPM

## 2016-07-28 DIAGNOSIS — K754 Autoimmune hepatitis: Secondary | ICD-10-CM | POA: Diagnosis not present

## 2016-08-31 DIAGNOSIS — K754 Autoimmune hepatitis: Secondary | ICD-10-CM | POA: Diagnosis not present

## 2016-09-01 DIAGNOSIS — E119 Type 2 diabetes mellitus without complications: Secondary | ICD-10-CM | POA: Diagnosis not present

## 2016-09-01 DIAGNOSIS — I10 Essential (primary) hypertension: Secondary | ICD-10-CM | POA: Diagnosis not present

## 2016-09-01 DIAGNOSIS — K754 Autoimmune hepatitis: Secondary | ICD-10-CM | POA: Diagnosis not present

## 2016-09-07 DIAGNOSIS — K760 Fatty (change of) liver, not elsewhere classified: Secondary | ICD-10-CM | POA: Diagnosis not present

## 2016-09-07 DIAGNOSIS — Z79899 Other long term (current) drug therapy: Secondary | ICD-10-CM | POA: Diagnosis not present

## 2016-09-07 DIAGNOSIS — K754 Autoimmune hepatitis: Secondary | ICD-10-CM | POA: Diagnosis not present

## 2016-10-03 DIAGNOSIS — K760 Fatty (change of) liver, not elsewhere classified: Secondary | ICD-10-CM | POA: Diagnosis not present

## 2016-11-02 DIAGNOSIS — K754 Autoimmune hepatitis: Secondary | ICD-10-CM | POA: Diagnosis not present

## 2016-11-13 DIAGNOSIS — K754 Autoimmune hepatitis: Secondary | ICD-10-CM | POA: Diagnosis not present

## 2016-11-27 DIAGNOSIS — K754 Autoimmune hepatitis: Secondary | ICD-10-CM | POA: Diagnosis not present

## 2016-12-29 DIAGNOSIS — E782 Mixed hyperlipidemia: Secondary | ICD-10-CM | POA: Diagnosis not present

## 2016-12-29 DIAGNOSIS — K754 Autoimmune hepatitis: Secondary | ICD-10-CM | POA: Diagnosis not present

## 2016-12-29 DIAGNOSIS — E119 Type 2 diabetes mellitus without complications: Secondary | ICD-10-CM | POA: Diagnosis not present

## 2016-12-29 DIAGNOSIS — I10 Essential (primary) hypertension: Secondary | ICD-10-CM | POA: Diagnosis not present

## 2016-12-29 DIAGNOSIS — Z125 Encounter for screening for malignant neoplasm of prostate: Secondary | ICD-10-CM | POA: Diagnosis not present

## 2017-01-08 DIAGNOSIS — K754 Autoimmune hepatitis: Secondary | ICD-10-CM | POA: Diagnosis not present

## 2017-02-13 DIAGNOSIS — K754 Autoimmune hepatitis: Secondary | ICD-10-CM | POA: Diagnosis not present

## 2017-02-13 DIAGNOSIS — J342 Deviated nasal septum: Secondary | ICD-10-CM | POA: Diagnosis not present

## 2017-02-13 DIAGNOSIS — K219 Gastro-esophageal reflux disease without esophagitis: Secondary | ICD-10-CM | POA: Diagnosis not present

## 2017-03-22 ENCOUNTER — Ambulatory Visit (INDEPENDENT_AMBULATORY_CARE_PROVIDER_SITE_OTHER): Payer: 59 | Admitting: Sports Medicine

## 2017-03-22 ENCOUNTER — Encounter (INDEPENDENT_AMBULATORY_CARE_PROVIDER_SITE_OTHER): Payer: Self-pay

## 2017-03-22 ENCOUNTER — Telehealth: Payer: Self-pay | Admitting: *Deleted

## 2017-03-22 DIAGNOSIS — M79671 Pain in right foot: Secondary | ICD-10-CM | POA: Diagnosis not present

## 2017-03-22 DIAGNOSIS — M722 Plantar fascial fibromatosis: Secondary | ICD-10-CM | POA: Diagnosis not present

## 2017-03-22 DIAGNOSIS — M79672 Pain in left foot: Secondary | ICD-10-CM

## 2017-03-22 DIAGNOSIS — M216X2 Other acquired deformities of left foot: Secondary | ICD-10-CM | POA: Diagnosis not present

## 2017-03-22 DIAGNOSIS — Z01812 Encounter for preprocedural laboratory examination: Secondary | ICD-10-CM

## 2017-03-22 DIAGNOSIS — M792 Neuralgia and neuritis, unspecified: Secondary | ICD-10-CM | POA: Diagnosis not present

## 2017-03-22 DIAGNOSIS — M216X1 Other acquired deformities of right foot: Secondary | ICD-10-CM | POA: Diagnosis not present

## 2017-03-22 DIAGNOSIS — D3610 Benign neoplasm of peripheral nerves and autonomic nervous system, unspecified: Secondary | ICD-10-CM

## 2017-03-22 MED ORDER — PREGABALIN 75 MG PO CAPS: 75.0000 mg | ORAL_CAPSULE | Freq: Two times a day (BID) | ORAL | 0 refills | Status: AC

## 2017-03-22 NOTE — Telephone Encounter (Addendum)
-----   Message from [place], [place] sent at 03/22/2017  3:50 PM EDT ----- Regarding: MRI Left forefoot Check for neuroma burning at 4-5 toes.03/30/2017-Jared Hodges MRI set appt 04/03/2017 arrive at 1:15pm for 1:30pm testing, pt must report to hospital outpt lab at 12:15pm for creatinine, and wear comfortable clothes and bring list of medications. Informed pt of appts. Faxed orders to [facility].

## 2017-03-22 NOTE — Progress Notes (Signed)
Patient ID: Jared Hodges, male   DOB: 17-Oct-1955, 61 y.o.   MRN: [id number]  Subjective: Jared Hodges is a 61 y.o. diabetic male patient seen today in office for foot pain states still has pain in arches even after having EPF on left and really bad burning pain and stinging of both feet L>R with pinpoint pain sub met 4 on left and absolute numbness at left 4-5 toes. Patient states that he returned to the things he learned last year and can not tell difference. No improvement with gabapentin.   States that his hepatitis is now resolved and has been off long term steroids x 3 months  There are no active problems to display for this patient.  Current Outpatient Prescriptions on File Prior to Visit  Medication Sig Dispense Refill  . gabapentin (NEURONTIN) 100 MG capsule Take 3 capsules (300 mg total) by mouth at bedtime. 90 capsule 3  . HYDROcodone-acetaminophen (NORCO) 7.5-325 MG tablet Take 1 tablet by mouth every 6 (six) hours as needed for moderate pain. 30 tablet 0  . lisinopril (PRINIVIL,ZESTRIL) 20 MG tablet TK 1 T PO DAILY FOR BLOOD PRESSURE  5  . meloxicam (MOBIC) 15 MG tablet Take 1 tablet (15 mg total) by mouth daily. 30 tablet 0  . metFORMIN (GLUCOPHAGE-XR) 500 MG 24 hr tablet TK 1 T PO D  3  . methylPREDNISolone (MEDROL DOSEPAK) 4 MG TBPK tablet TK UTD  0  . metroNIDAZOLE (FLAGYL) 500 MG tablet Take 1 tablet (500 mg total) by mouth 3 (three) times daily. 21 tablet 0  . omeprazole (PRILOSEC) 20 MG capsule TK 1 C PO QD FOR ACID SUPPRESSION  1  . promethazine (PHENERGAN) 25 MG tablet     . traMADol (ULTRAM) 50 MG tablet Take 1 tablet (50 mg total) by mouth every 8 (eight) hours as needed. 30 tablet 0   Current Facility-Administered Medications on File Prior to Visit  Medication Dose Route Frequency Provider Last Rate Last Dose  . triamcinolone acetonide (KENALOG) 10 MG/ML injection 10 mg  10 mg Other Once Landis Martins, DPM      . triamcinolone acetonide (KENALOG) 10 MG/ML  injection 10 mg  10 mg Other Once Landis Martins, DPM       No Known Allergies    Objective: General: No acute distress, AAOx3   Left foot: There is no tenderness left heel at surgical sites, however there is pain in arch, no ecchymosis to left heel, no erythema, no warmth, no drainage, no signs of infection noted, Capillary fill time <3 seconds in all digits, gross sensation present via light touch to left foot. There is subjective numbness to toes 4 & 5 with pinpoint pain sub met 4. Negative tinels sign/mulder's sign. No other pain or crepitation with range of motion left foot. Limited ankle range of motion supportive of equinus.  No pain with calf compression.   Right foot: There is mild enderness to plantar fascia on right foot at the insertion; no ecchymosis, no obvious palpable gap or dell. Neurovascular status intact. Subjective burning sensation. Negative Tinel's or Mulder sign. Negative pain with tuning fork to calcaneus. Limited ankle range of motion supportive of equinus. No other acute findings on the right foot.    Assessment and Plan:  Problem List Items Addressed This Visit    None    Visit Diagnoses    Neuritis    -  Primary   possible neuroma left foot   Relevant Medications   pregabalin (LYRICA) 75 MG  capsule   Plantar fasciitis, bilateral       Foot pain, bilateral       Acquired equinus deformity of both feet          -Patient seen and evaluated -Discussed continued plan of care/ Long-term treatment options.  -Rx MRI left for increased SUB met 4 pain to check for neuroma -Patient admits that he never went to PT like he should of -Rx Lyrica 1 week trial if works well then will put patient on long standing dose -Continue with stretching, icing, and night splint daily -Recommend continue with good supportive shoes daily; will consider custom orthotics at a later time; applied dancer pad sub met 4 to see if this will give some additional relief -Patient to return  after MRI. Patient to call office if any issues or problems arise.   Landis Martins, DPM

## 2017-04-03 DIAGNOSIS — M7989 Other specified soft tissue disorders: Secondary | ICD-10-CM | POA: Diagnosis not present

## 2017-04-03 DIAGNOSIS — D3610 Benign neoplasm of peripheral nerves and autonomic nervous system, unspecified: Secondary | ICD-10-CM | POA: Diagnosis not present

## 2017-04-03 DIAGNOSIS — Z01812 Encounter for preprocedural laboratory examination: Secondary | ICD-10-CM | POA: Diagnosis not present

## 2017-04-09 NOTE — Telephone Encounter (Signed)
Cymbalta 60mg  daily, 30 tabs with 2 refills Dr. Chauncey Cruel

## 2017-04-09 NOTE — Telephone Encounter (Addendum)
-----   Message from Landis Martins, [place] sent at 04/06/2017  5:03 PM EDT ----- Regarding: Lyrica  Will you let patient know that his insurance will not cover any additional Lyrica. His insurance is requesting that we try a comparable generic option of Cymbalta instead. -Dr. Cannon Kettle.04/09/2017-I informed pt's wife, Jana Half of Dr. Leeanne Rio insurance coverage information and Jana Half states the cymbalta would be okay. Jana Half asked for MRI results and I told her Dr. Cannon Kettle preferred to discuss with pt in office, and I transferred to schedulers.04/10/2017-Orders for Cymbalta sent to [facility].

## 2017-04-10 MED ORDER — DULOXETINE HCL 60 MG PO CPEP: 60.0000 mg | ORAL_CAPSULE | Freq: Every day | ORAL | 2 refills | Status: AC

## 2017-04-13 ENCOUNTER — Ambulatory Visit (INDEPENDENT_AMBULATORY_CARE_PROVIDER_SITE_OTHER): Payer: 59 | Admitting: Sports Medicine

## 2017-04-13 DIAGNOSIS — M216X1 Other acquired deformities of right foot: Secondary | ICD-10-CM

## 2017-04-13 DIAGNOSIS — M79673 Pain in unspecified foot: Secondary | ICD-10-CM

## 2017-04-13 DIAGNOSIS — M722 Plantar fascial fibromatosis: Secondary | ICD-10-CM | POA: Diagnosis not present

## 2017-04-13 DIAGNOSIS — M79672 Pain in left foot: Secondary | ICD-10-CM

## 2017-04-13 DIAGNOSIS — M79671 Pain in right foot: Secondary | ICD-10-CM

## 2017-04-13 DIAGNOSIS — M792 Neuralgia and neuritis, unspecified: Secondary | ICD-10-CM

## 2017-04-13 DIAGNOSIS — M216X2 Other acquired deformities of left foot: Secondary | ICD-10-CM

## 2017-04-13 MED ORDER — DICLOFENAC EPOLAMINE 1.3 % TD PTCH: 1.0000 | MEDICATED_PATCH | Freq: Every day | TRANSDERMAL | 1 refills | Status: AC

## 2017-04-13 MED ORDER — DEXAMETHASONE SODIUM PHOSPHATE 120 MG/30ML IJ SOLN: 4.0000 mg | Freq: Once | INTRAMUSCULAR | Status: AC

## 2017-04-13 MED ORDER — TRIAMCINOLONE ACETONIDE 10 MG/ML IJ SUSP: 10.0000 mg | Freq: Once | INTRAMUSCULAR | Status: AC

## 2017-04-14 MED ORDER — DEXAMETHASONE SODIUM PHOSPHATE 120 MG/30ML IJ SOLN: 4.0000 mg | Freq: Once | INTRAMUSCULAR | Status: AC

## 2017-04-14 MED ORDER — TRIAMCINOLONE ACETONIDE 10 MG/ML IJ SUSP: 10.0000 mg | Freq: Once | INTRAMUSCULAR | Status: AC

## 2017-04-14 NOTE — Progress Notes (Signed)
Patient ID: Jared Hodges, male   DOB: 08-Jan-1956, 61 y.o.   MRN: [id number]  Subjective: Jared Hodges is a 61 y.o. diabetic male patient seen today in office for MRI review and for follow up on foot pain states still the same, no change on left with really bad burning pain and stinging of both feet L>R with pinpoint pain sub met 4 on left and absolute numbness at left 4-5 toes. Patient states that pad I gave last visit helped. Reports no difference with lyrica however still has to pick up his Cymbalta. Denies any other pedal complaints.   There are no active problems to display for this patient.  Current Outpatient Prescriptions on File Prior to Visit  Medication Sig Dispense Refill  . DULoxetine (CYMBALTA) 60 MG capsule Take 1 capsule (60 mg total) by mouth daily. 30 capsule 2  . gabapentin (NEURONTIN) 100 MG capsule Take 3 capsules (300 mg total) by mouth at bedtime. 90 capsule 3  . HYDROcodone-acetaminophen (NORCO) 7.5-325 MG tablet Take 1 tablet by mouth every 6 (six) hours as needed for moderate pain. 30 tablet 0  . lisinopril (PRINIVIL,ZESTRIL) 20 MG tablet TK 1 T PO DAILY FOR BLOOD PRESSURE  5  . meloxicam (MOBIC) 15 MG tablet Take 1 tablet (15 mg total) by mouth daily. 30 tablet 0  . metFORMIN (GLUCOPHAGE-XR) 500 MG 24 hr tablet TK 1 T PO D  3  . methylPREDNISolone (MEDROL DOSEPAK) 4 MG TBPK tablet TK UTD  0  . metroNIDAZOLE (FLAGYL) 500 MG tablet Take 1 tablet (500 mg total) by mouth 3 (three) times daily. 21 tablet 0  . omeprazole (PRILOSEC) 20 MG capsule TK 1 C PO QD FOR ACID SUPPRESSION  1  . pregabalin (LYRICA) 75 MG capsule Take 1 capsule (75 mg total) by mouth 2 (two) times daily. 14 capsule 0  . promethazine (PHENERGAN) 25 MG tablet     . traMADol (ULTRAM) 50 MG tablet Take 1 tablet (50 mg total) by mouth every 8 (eight) hours as needed. 30 tablet 0   Current Facility-Administered Medications on File Prior to Visit  Medication Dose Route Frequency Provider Last Rate Last  Dose  . triamcinolone acetonide (KENALOG) 10 MG/ML injection 10 mg  10 mg Other Once Landis Martins, DPM      . triamcinolone acetonide (KENALOG) 10 MG/ML injection 10 mg  10 mg Other Once Landis Martins, DPM       No Known Allergies    Objective: General: No acute distress, AAOx3   Left foot: There is no tenderness left heel at surgical sites, however there is pain in arch, no ecchymosis to left heel, no erythema, no warmth, no drainage, no signs of infection noted, Capillary fill time <3 seconds in all digits, gross sensation present via light touch to left foot. There is subjective numbness to toes 4 & 5 with pinpoint pain sub met 4. Negative tinels sign/mulder's sign. No other pain or crepitation with range of motion left foot. Limited ankle range of motion supportive of equinus.  No pain with calf compression.   Right foot: There is mild tenderness to plantar fascia on right foot at the insertion and arch; no ecchymosis, no obvious palpable gap or dell. Neurovascular status intact. Subjective burning sensation. Negative Tinel's or Mulder sign. Negative pain with tuning fork to calcaneus. Limited ankle range of motion supportive of equinus. No other acute findings on the right foot.    Assessment and Plan:  Problem List Items Addressed This Visit  None    Visit Diagnoses    Plantar fibromatosis    -  Primary   distal sub met 4 on left per MRI   Relevant Medications   triamcinolone acetonide (KENALOG) 10 MG/ML injection 10 mg   dexamethasone (DECADRON) injection 4 mg   diclofenac (FLECTOR) 1.3 % PT[facility]   triamcinolone acetonide (KENALOG) 10 MG/ML injection 10 mg   dexamethasone (DECADRON) injection 4 mg   Foot arch pain, unspecified laterality       Neuritis       Acquired equinus deformity of both feet       Foot pain, bilateral         -Patient seen and evaluated -Discussed continued plan of care/ Long-term treatment options.  -MRI reviewed suggestive of fibroma sub met 4 on  left -After oral consent and aseptic prep, injected a mixture containing 1 ml of 2%  plain lidocaine, 1 ml 0.5% plain marcaine, 0.5 ml of kenalog 10 and 0.5 ml of dexamethasone phosphate into sub met 4 on left without complication. Post-injection care discussed with patient.  -Rx Flector patch to use to site -Patient to pick up Cymbalta/Duloxetine   -Continue with stretching, icing, and night splint daily -Recommend continue with good supportive shoes daily; will consider custom orthotics at a later time; applied arch pad and gave dancer pad sub met 4 for additional relief. -Patient to return in 3-4 weeks for follow up exam. If no improvement will resume PT and try fascial strapping . Patient to call office if any issues or problems arise.   Landis Martins, DPM

## 2017-04-17 ENCOUNTER — Telehealth: Payer: Self-pay | Admitting: Sports Medicine

## 2017-04-17 NOTE — Telephone Encounter (Signed)
He can try taking 1/2 the pill. If symptoms persists then discontinue the medication -Dr. Cannon Kettle

## 2017-04-17 NOTE — Telephone Encounter (Signed)
Patient states that he has had an adverse reaction to Cymbalta/Duloxetine with sleepiness, nausea, anxiousness and shakiness.

## 2017-04-19 DIAGNOSIS — K760 Fatty (change of) liver, not elsewhere classified: Secondary | ICD-10-CM | POA: Diagnosis not present

## 2017-04-19 DIAGNOSIS — K754 Autoimmune hepatitis: Secondary | ICD-10-CM | POA: Diagnosis not present

## 2017-05-04 ENCOUNTER — Ambulatory Visit: Payer: 59 | Admitting: Sports Medicine

## 2017-05-09 ENCOUNTER — Ambulatory Visit: Payer: 59 | Admitting: Sports Medicine

## 2017-05-22 DIAGNOSIS — Z23 Encounter for immunization: Secondary | ICD-10-CM | POA: Diagnosis not present

## 2017-05-23 ENCOUNTER — Ambulatory Visit: Payer: Self-pay | Admitting: Sports Medicine

## 2017-05-31 ENCOUNTER — Ambulatory Visit: Payer: Self-pay | Admitting: Sports Medicine

## 2017-06-07 ENCOUNTER — Ambulatory Visit (INDEPENDENT_AMBULATORY_CARE_PROVIDER_SITE_OTHER): Payer: 59 | Admitting: Sports Medicine

## 2017-06-07 ENCOUNTER — Encounter: Payer: Self-pay | Admitting: Sports Medicine

## 2017-06-07 DIAGNOSIS — M722 Plantar fascial fibromatosis: Secondary | ICD-10-CM

## 2017-06-07 DIAGNOSIS — M779 Enthesopathy, unspecified: Secondary | ICD-10-CM

## 2017-06-07 DIAGNOSIS — D3610 Benign neoplasm of peripheral nerves and autonomic nervous system, unspecified: Secondary | ICD-10-CM

## 2017-06-07 DIAGNOSIS — M79672 Pain in left foot: Secondary | ICD-10-CM

## 2017-06-07 DIAGNOSIS — M7752 Other enthesopathy of left foot: Secondary | ICD-10-CM | POA: Diagnosis not present

## 2017-06-07 MED ORDER — TRIAMCINOLONE ACETONIDE 40 MG/ML IJ SUSP: 20.0000 mg | Freq: Once | INTRAMUSCULAR | Status: AC

## 2017-06-07 NOTE — Progress Notes (Signed)
Patient ID: Jared Hodges, male   DOB: 12/19/1955, 61 y.o.   MRN: [id number]  Subjective: Jared Hodges is a 61 y.o. diabetic male patient seen today in office for follow up on Left foot pain states still the same,injection helped for several days but now pain is back sub met 4 on left and still gets numbness at left 4-5 toes.  Patient reports that he could not continue with taking the Cymbalta medication because of side effects states that he does not like how the medicine makes him feel.  Patient denies any other pedal complaints.   There are no active problems to display for this patient.  Current Outpatient Medications on File Prior to Visit  Medication Sig Dispense Refill  . diclofenac (FLECTOR) 1.3 % PTCH Place 1 patch onto the skin daily. 21 patch 1  . DULoxetine (CYMBALTA) 60 MG capsule Take 1 capsule (60 mg total) by mouth daily. 30 capsule 2  . gabapentin (NEURONTIN) 100 MG capsule Take 3 capsules (300 mg total) by mouth at bedtime. 90 capsule 3  . HYDROcodone-acetaminophen (NORCO) 7.5-325 MG tablet Take 1 tablet by mouth every 6 (six) hours as needed for moderate pain. 30 tablet 0  . lisinopril (PRINIVIL,ZESTRIL) 20 MG tablet TK 1 T PO DAILY FOR BLOOD PRESSURE  5  . meloxicam (MOBIC) 15 MG tablet Take 1 tablet (15 mg total) by mouth daily. 30 tablet 0  . metFORMIN (GLUCOPHAGE-XR) 500 MG 24 hr tablet TK 1 T PO D  3  . methylPREDNISolone (MEDROL DOSEPAK) 4 MG TBPK tablet TK UTD  0  . metroNIDAZOLE (FLAGYL) 500 MG tablet Take 1 tablet (500 mg total) by mouth 3 (three) times daily. 21 tablet 0  . omeprazole (PRILOSEC) 20 MG capsule TK 1 C PO QD FOR ACID SUPPRESSION  1  . pregabalin (LYRICA) 75 MG capsule Take 1 capsule (75 mg total) by mouth 2 (two) times daily. 14 capsule 0  . promethazine (PHENERGAN) 25 MG tablet     . traMADol (ULTRAM) 50 MG tablet Take 1 tablet (50 mg total) by mouth every 8 (eight) hours as needed. 30 tablet 0   Current Facility-Administered Medications on File  Prior to Visit  Medication Dose Route Frequency Provider Last Rate Last Dose  . dexamethasone (DECADRON) injection 4 mg  4 mg Intra-articular Once Gianny Killman, DPM      . dexamethasone (DECADRON) injection 4 mg  4 mg Intra-articular Once Shruti Arrey, DPM      . triamcinolone acetonide (KENALOG) 10 MG/ML injection 10 mg  10 mg Other Once [place], Michael Walrath, DPM      . triamcinolone acetonide (KENALOG) 10 MG/ML injection 10 mg  10 mg Other Once [place], Kaina Orengo, DPM      . triamcinolone acetonide (KENALOG) 10 MG/ML injection 10 mg  10 mg Other Once [place], Zykera Abella, DPM      . triamcinolone acetonide (KENALOG) 10 MG/ML injection 10 mg  10 mg Other Once Landis Martins, DPM       No Known Allergies    Objective: General: No acute distress, AAOx3   Left foot: There is no tenderness left heel at surgical sites, however there is mild pain in arch, no ecchymosis to left heel, no erythema, no warmth, no drainage, no signs of infection noted, Capillary fill time <3 seconds in all digits, gross sensation present via light touch to left foot. There is subjective numbness to toes 4 & 5 with pinpoint pain sub met 4. Negative tinels sign/mulder's sign. No other  pain or crepitation with range of motion left foot. Limited ankle range of motion supportive of equinus.  No pain with calf compression.   Right foot: There is no tenderness to plantar fascia on right foot at the insertion but mild subjective pain in the arch; no ecchymosis, no obvious palpable gap or dell. Neurovascular status intact. Subjective burning sensation. Negative Tinel's or Mulder sign. Negative pain with tuning fork to calcaneus. Limited ankle range of motion supportive of equinus. No other acute findings on the right foot.    Assessment and Plan:  Problem List Items Addressed This Visit    None    Visit Diagnoses    Capsulitis    -  Primary   Relevant Medications   triamcinolone acetonide (KENALOG-40) injection 20 mg (Start on  06/07/2017  6:00 PM)   Plantar fibromatosis       Neuroma       Relevant Medications   triamcinolone acetonide (KENALOG-40) injection 20 mg (Start on 06/07/2017  6:00 PM)   Left foot pain       Relevant Medications   triamcinolone acetonide (KENALOG-40) injection 20 mg (Start on 06/07/2017  6:00 PM)     -Patient seen and evaluated -Re-Discussed continued plan of care/ Long-term treatment options.  -MRI previously reviewed suggestive of fibroma sub met 4 on left -After oral consent and aseptic prep, injected a mixture containing 1 ml of 2%  plain lidocaine, 1 ml 0.5% plain marcaine, 0.5 ml of kenalog 40 and 0.5 ml of dexamethasone phosphate into sub met 4 on left without complication. Post-injection care discussed with patient.  -Applied metatarsal strapping to left forefoot -Continue with Flector patch to use to site -Continue with stretching, icing, and night splint daily -Recommend continue with good supportive shoes daily; will consider custom orthotics at a later time if removable padding offers relief -Patient to return in 4 weeks for follow up exam. If no improvement will resume PT. Patient to call office if any issues or problems arise.   Landis Martins, DPM

## 2017-07-09 DIAGNOSIS — I10 Essential (primary) hypertension: Secondary | ICD-10-CM | POA: Diagnosis not present

## 2017-07-09 DIAGNOSIS — E119 Type 2 diabetes mellitus without complications: Secondary | ICD-10-CM | POA: Diagnosis not present

## 2017-07-09 DIAGNOSIS — E782 Mixed hyperlipidemia: Secondary | ICD-10-CM | POA: Diagnosis not present

## 2017-07-09 DIAGNOSIS — K754 Autoimmune hepatitis: Secondary | ICD-10-CM | POA: Diagnosis not present

## 2017-07-11 ENCOUNTER — Ambulatory Visit: Payer: 59 | Admitting: Sports Medicine

## 2017-07-20 DIAGNOSIS — K754 Autoimmune hepatitis: Secondary | ICD-10-CM | POA: Diagnosis not present

## 2017-07-27 ENCOUNTER — Encounter: Payer: Self-pay | Admitting: Sports Medicine

## 2017-07-27 ENCOUNTER — Ambulatory Visit (INDEPENDENT_AMBULATORY_CARE_PROVIDER_SITE_OTHER): Payer: 59 | Admitting: Sports Medicine

## 2017-07-27 DIAGNOSIS — M79672 Pain in left foot: Secondary | ICD-10-CM | POA: Diagnosis not present

## 2017-07-27 DIAGNOSIS — M779 Enthesopathy, unspecified: Secondary | ICD-10-CM | POA: Diagnosis not present

## 2017-07-27 DIAGNOSIS — M722 Plantar fascial fibromatosis: Secondary | ICD-10-CM

## 2017-07-27 DIAGNOSIS — D3610 Benign neoplasm of peripheral nerves and autonomic nervous system, unspecified: Secondary | ICD-10-CM | POA: Diagnosis not present

## 2017-07-27 NOTE — Progress Notes (Signed)
Patient ID: Jared Hodges, male   DOB: Nov 30, 1955, 62 y.o.   MRN: [id number]  Subjective: Jared Hodges is a 62 y.o. diabetic male patient seen today in office for follow up on Left foot pain states still the same, it may be a little better reports that the padding really helps states that he still struggles with pinpoint pain to the ball of his left foot and numbness at left fourth and fifth toes with tingling sensations.  Patient denies swelling or bruising.  Patient denies any constitutional symptoms of nausea vomiting fever chills night sweats.  Patient denies any other pedal complaints.   There are no active problems to display for this patient.  Current Outpatient Medications on File Prior to Visit  Medication Sig Dispense Refill  . diclofenac (FLECTOR) 1.3 % PT[facility] Place 1 patch onto the skin daily. 21 patch 1  . DULoxetine (CYMBALTA) 60 MG capsule Take 1 capsule (60 mg total) by mouth daily. 30 capsule 2  . gabapentin (NEURONTIN) 100 MG capsule Take 3 capsules (300 mg total) by mouth at bedtime. 90 capsule 3  . HYDROcodone-acetaminophen (NORCO) 7.5-325 MG tablet Take 1 tablet by mouth every 6 (six) hours as needed for moderate pain. 30 tablet 0  . lisinopril (PRINIVIL,ZESTRIL) 20 MG tablet TK 1 T PO DAILY FOR BLOOD PRESSURE  5  . meloxicam (MOBIC) 15 MG tablet Take 1 tablet (15 mg total) by mouth daily. 30 tablet 0  . metFORMIN (GLUCOPHAGE-XR) 500 MG 24 hr tablet TK 1 T PO D  3  . methylPREDNISolone (MEDROL DOSEPAK) 4 MG TBPK tablet TK UTD  0  . metroNIDAZOLE (FLAGYL) 500 MG tablet Take 1 tablet (500 mg total) by mouth 3 (three) times daily. 21 tablet 0  . omeprazole (PRILOSEC) 20 MG capsule TK 1 C PO QD FOR ACID SUPPRESSION  1  . pregabalin (LYRICA) 75 MG capsule Take 1 capsule (75 mg total) by mouth 2 (two) times daily. 14 capsule 0  . promethazine (PHENERGAN) 25 MG tablet     . traMADol (ULTRAM) 50 MG tablet Take 1 tablet (50 mg total) by mouth every 8 (eight) hours as needed. 30  tablet 0   Current Facility-Administered Medications on File Prior to Visit  Medication Dose Route Frequency Provider Last Rate Last Dose  . dexamethasone (DECADRON) injection 4 mg  4 mg Intra-articular Once Burwell Bethel, DPM      . dexamethasone (DECADRON) injection 4 mg  4 mg Intra-articular Once Cannon Kettle, Burnie Hank, DPM      . triamcinolone acetonide (KENALOG) 10 MG/ML injection 10 mg  10 mg Other Once [place], Emberlynn Riggan, DPM      . triamcinolone acetonide (KENALOG) 10 MG/ML injection 10 mg  10 mg Other Once Landis Martins, DPM      . triamcinolone acetonide (KENALOG) 10 MG/ML injection 10 mg  10 mg Other Once Landis Martins, DPM      . triamcinolone acetonide (KENALOG) 10 MG/ML injection 10 mg  10 mg Other Once [place], Keyoni Lapinski, DPM      . triamcinolone acetonide (KENALOG-40) injection 20 mg  20 mg Other Once Landis Martins, DPM       No Known Allergies    Objective: General: No acute distress, AAOx3   Left foot: There is no tenderness left heel at surgical sites, no ecchymosis to left heel, no erythema, no warmth, no drainage, no signs of infection noted, Capillary fill time <3 seconds in all digits, gross sensation present via light touch to left foot. There is  subjective numbness to toes 4 & 5 with mild pinpoint pain sub met 4. Negative tinels sign/mulder's sign. No other pain or crepitation with range of motion left foot. Limited ankle range of motion supportive of equinus.  No pain with calf compression.   Right foot: There is no tenderness to plantar fascia on right foot at the insertion but mild subjective pain in the arch; no ecchymosis, no obvious palpable gap or dell. Neurovascular status intact. Subjective burning sensation. Negative Tinel's or Mulder sign. Negative pain with tuning fork to calcaneus. Limited ankle range of motion supportive of equinus. No other acute findings on the right foot.    Assessment and Plan:  Problem List Items Addressed This Visit    None    Visit  Diagnoses    Plantar fibromatosis    -  Primary   Neuroma       Capsulitis       Left foot pain         -Patient seen and evaluated -Re-Discussed continued plan of care/ Long-term treatment options.  -Continue with offloading padding.  Office will check orthotic benefits for custom molded insoles.  Patient to be casted for orthotics if he desires to get them based on his benefit coverage. -Continue with Flector patch to use to site -Continue with stretching, icing, and night splint daily -Patient to return  as needed. Patient to call office if any issues or problems arise.   Landis Martins, DPM

## 2017-11-13 DIAGNOSIS — K754 Autoimmune hepatitis: Secondary | ICD-10-CM | POA: Diagnosis not present
